# Patient Record
Sex: Female | Born: 1960 | Race: White | Hispanic: No | Marital: Married | Smoking: Never smoker
Health system: Southern US, Community
[De-identification: ages and names within clinical notes are randomized; demographics above are authoritative.]

## PROBLEM LIST (undated history)

## (undated) DIAGNOSIS — E785 Hyperlipidemia, unspecified: Secondary | ICD-10-CM

## (undated) DIAGNOSIS — Z9109 Other allergy status, other than to drugs and biological substances: Secondary | ICD-10-CM

## (undated) DIAGNOSIS — M8580 Other specified disorders of bone density and structure, unspecified site: Secondary | ICD-10-CM

## (undated) DIAGNOSIS — G4730 Sleep apnea, unspecified: Secondary | ICD-10-CM

## (undated) DIAGNOSIS — Z973 Presence of spectacles and contact lenses: Secondary | ICD-10-CM

## (undated) DIAGNOSIS — E119 Type 2 diabetes mellitus without complications: Secondary | ICD-10-CM

## (undated) DIAGNOSIS — I10 Essential (primary) hypertension: Secondary | ICD-10-CM

## (undated) HISTORY — PX: TONSILLECTOMY: SUR1361

## (undated) HISTORY — DX: Type 2 diabetes mellitus without complications: E11.9

## (undated) HISTORY — PX: COLONOSCOPY: SHX174

## (undated) HISTORY — PX: TUBAL LIGATION: SHX77

## (undated) HISTORY — PX: CHOLECYSTECTOMY: SHX55

## (undated) HISTORY — PX: APPENDECTOMY: SHX54

---

## 2005-05-03 HISTORY — PX: ABDOMINAL HYSTERECTOMY: SHX81

## 2005-06-14 ENCOUNTER — Other Ambulatory Visit: Admission: RE | Admit: 2005-06-14 | Discharge: 2005-06-14 | Payer: Self-pay | Admitting: Obstetrics and Gynecology

## 2005-07-16 ENCOUNTER — Ambulatory Visit (HOSPITAL_COMMUNITY): Admission: RE | Admit: 2005-07-16 | Discharge: 2005-07-16 | Payer: Self-pay | Admitting: Obstetrics and Gynecology

## 2005-07-29 ENCOUNTER — Encounter (INDEPENDENT_AMBULATORY_CARE_PROVIDER_SITE_OTHER): Payer: Self-pay | Admitting: *Deleted

## 2005-07-29 ENCOUNTER — Encounter (INDEPENDENT_AMBULATORY_CARE_PROVIDER_SITE_OTHER): Payer: Self-pay | Admitting: Specialist

## 2005-07-29 ENCOUNTER — Inpatient Hospital Stay (HOSPITAL_COMMUNITY): Admission: RE | Admit: 2005-07-29 | Discharge: 2005-07-31 | Payer: Self-pay | Admitting: Obstetrics and Gynecology

## 2006-08-25 LAB — HM DEXA SCAN

## 2006-11-10 ENCOUNTER — Ambulatory Visit: Payer: Self-pay | Admitting: Internal Medicine

## 2006-11-24 ENCOUNTER — Ambulatory Visit: Payer: Self-pay | Admitting: Internal Medicine

## 2006-11-24 ENCOUNTER — Encounter: Payer: Self-pay | Admitting: Internal Medicine

## 2007-09-08 IMAGING — CT CT PELVIS W/ CM
1 of 3 series · 13 of 32 positions shown, 18 images · IV contrast ([ID] READICAT & 150ml omni/300%)
Comparison: none

CLINICAL DATA: Cystic ovarian mass.  Possible ovarian neoplasm. 
 ABDOMEN CT WITH CONTRAST:
TECHNIQUE: Multidetector CT imaging of the abdomen was performed following the standard protocol during bolus administration of intravenous contrast.
 Contrast:  150 cc Omnipaque 300 and oral contrast.
TECHNIQUE: Multidetector CT imaging of the pelvis was performed following the standard protocol during bolus administration of intravenous contrast.

[Series 2: abd pelvis · axial · 0.71mm/px · z∈[-519,-109]mm · 13 of 94 slices shown, 18 images]
[im 6/94  soft-tissue]
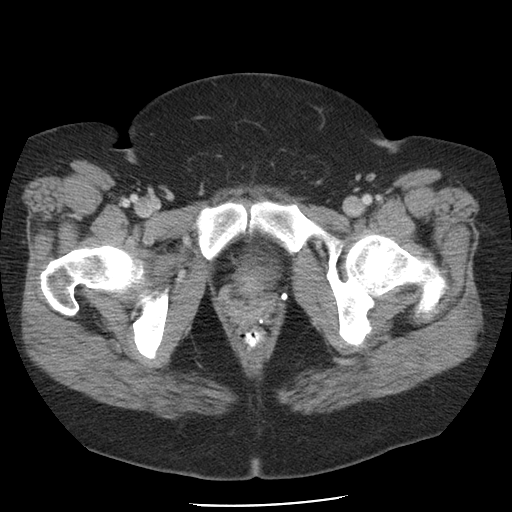
[im 6/94  bone]
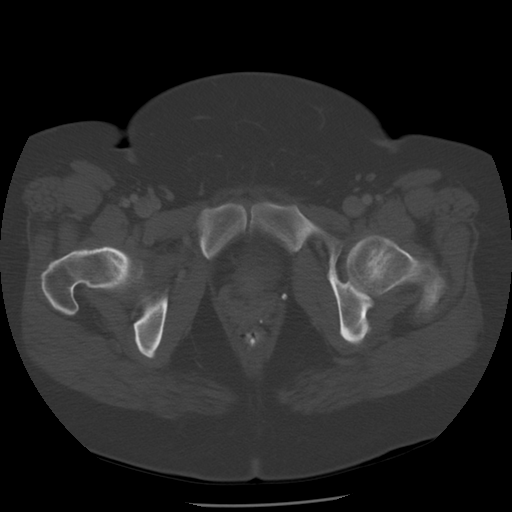
[im 16/94  soft-tissue]
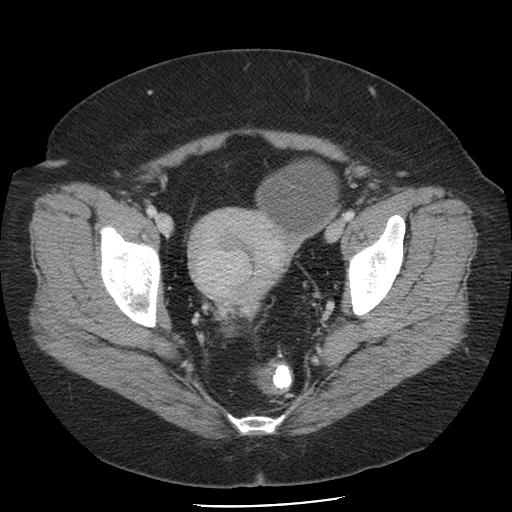
[im 21/94  soft-tissue]
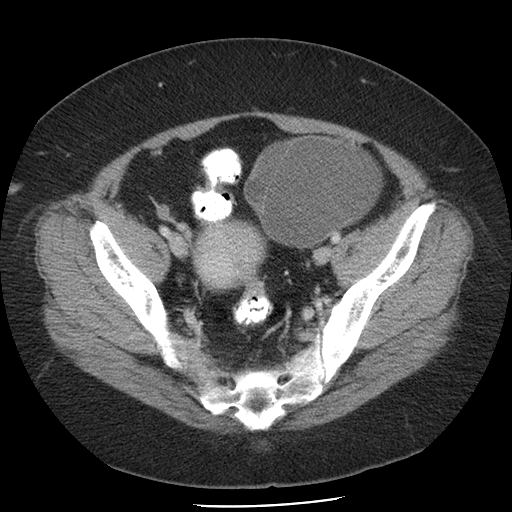
[im 26/94  soft-tissue]
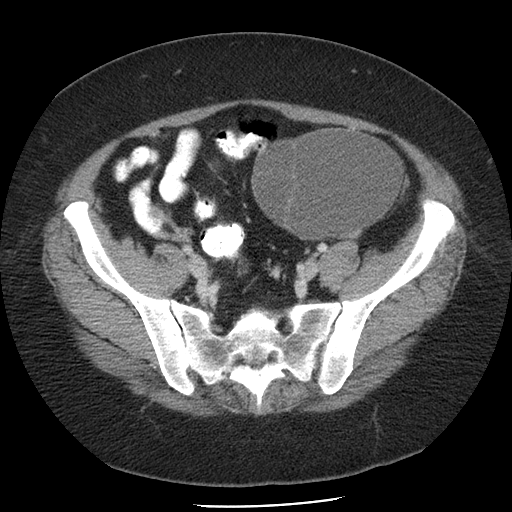
[im 37/94  soft-tissue]
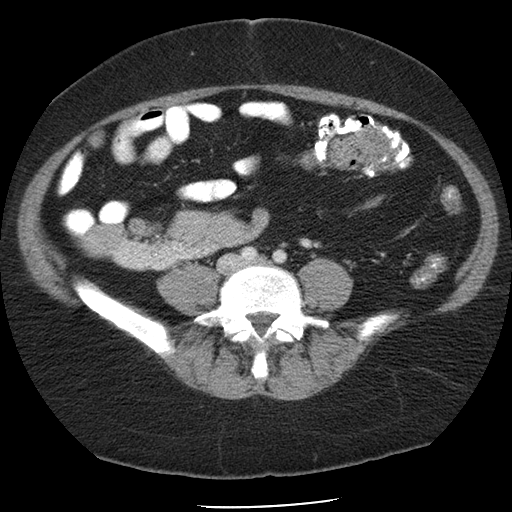
[im 42/94  soft-tissue]
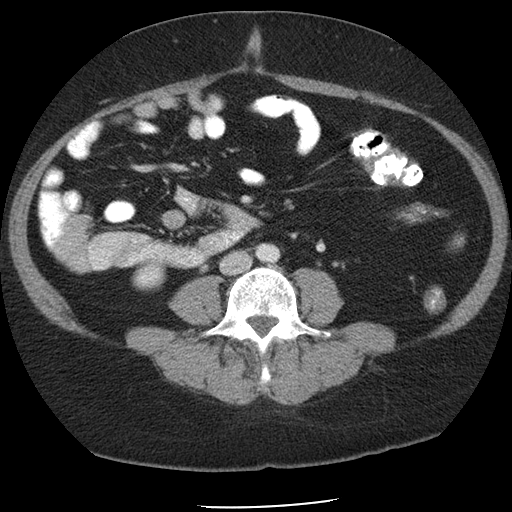
[im 52/94  soft-tissue]
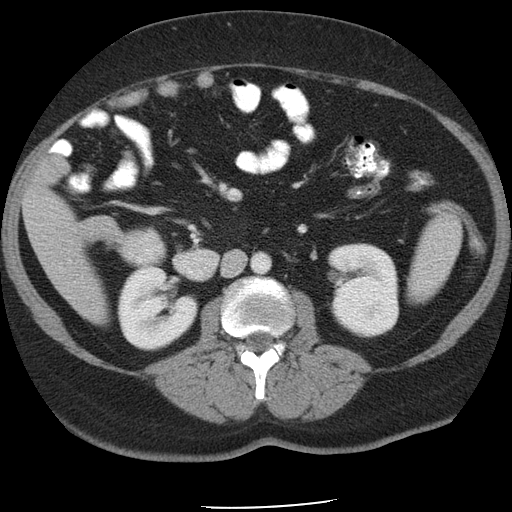
[im 57/94  soft-tissue]
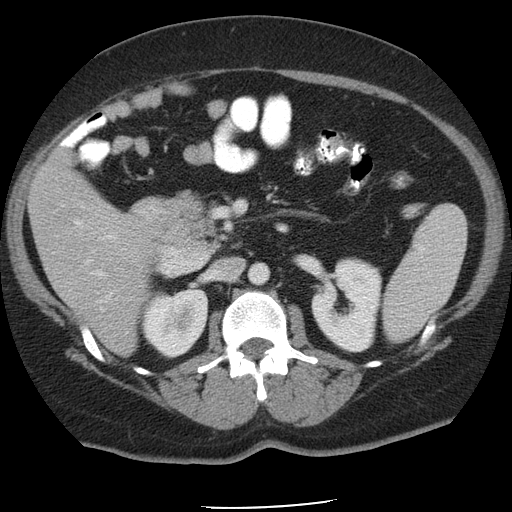
[im 68/94  soft-tissue]
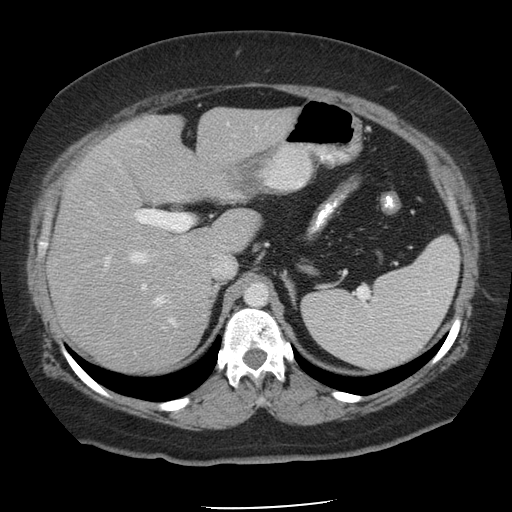
[im 68/94  bone]
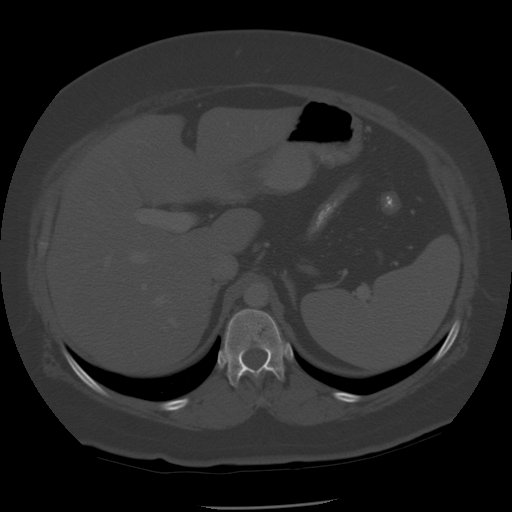
[im 73/94  soft-tissue]
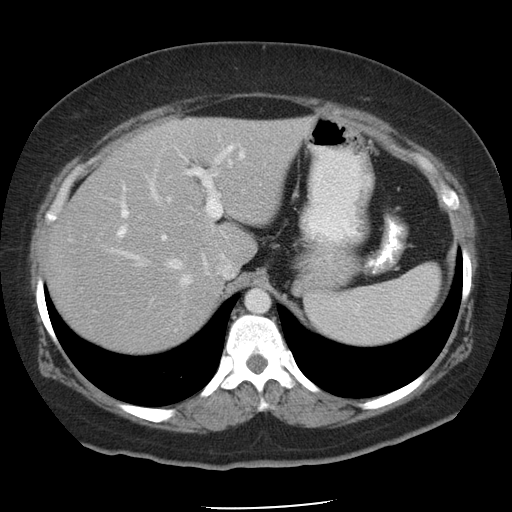
[im 73/94  lung]
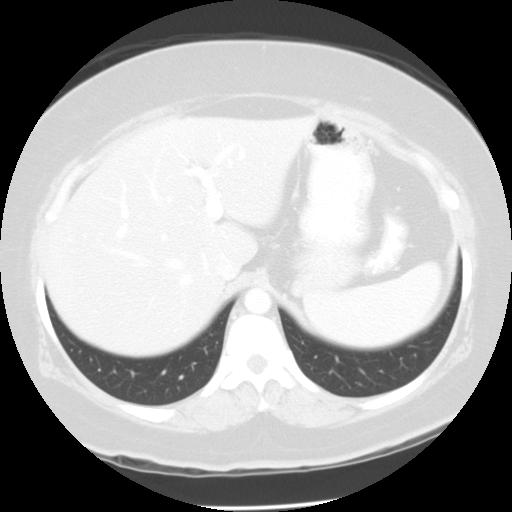
[im 78/94  soft-tissue]
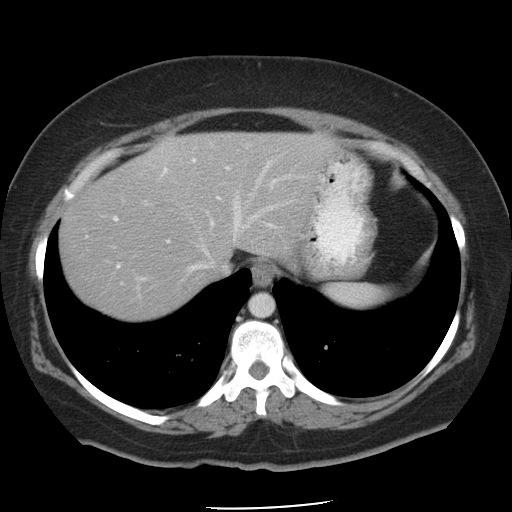
[im 78/94  lung]
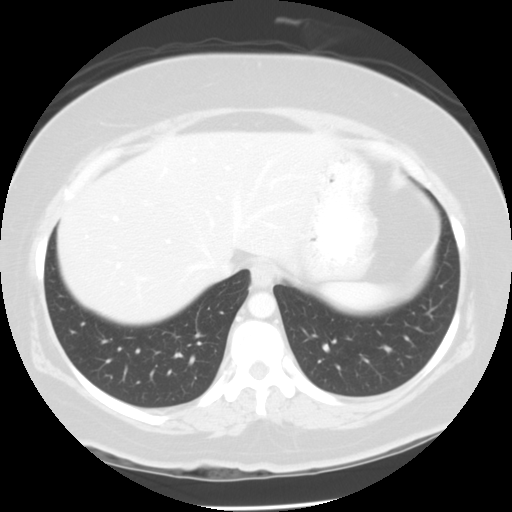
[im 83/94  lung]
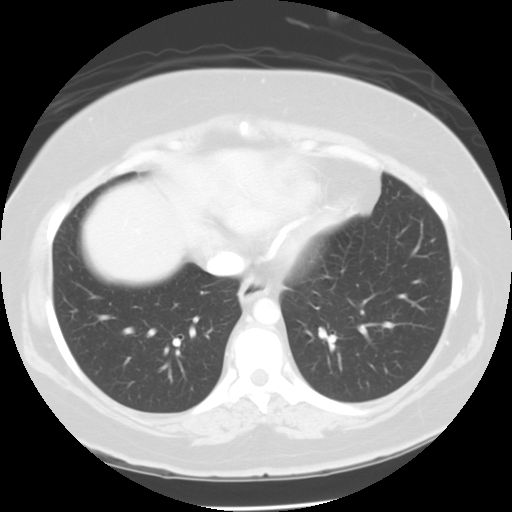
[im 88/94  soft-tissue]
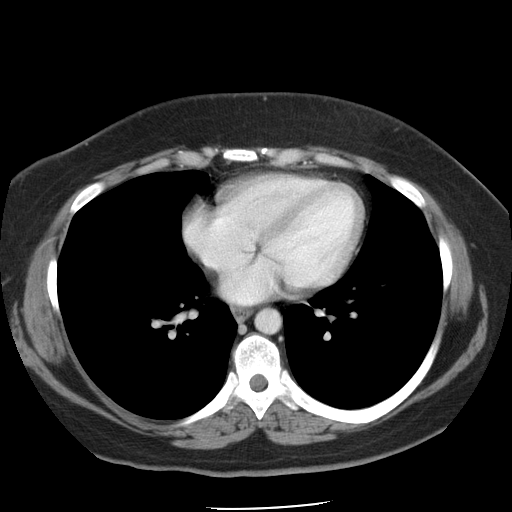
[im 88/94  lung]
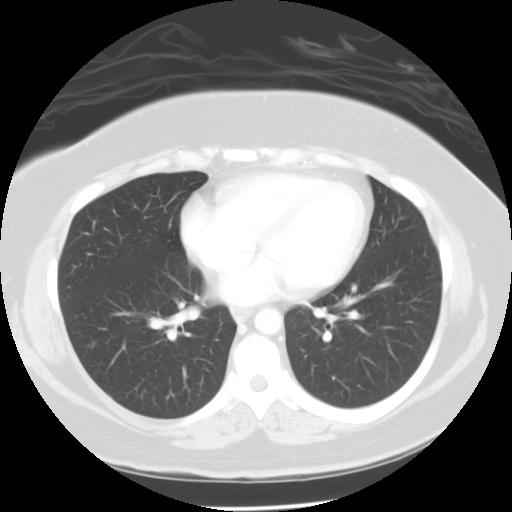

[13 of 32 positions shown; findings below may reference images not displayed]

FINDINGS: Surgical clips are seen from prior cholecystectomy.  There is no evidence of biliary ductal dilatation.  The liver, spleen, pancreas, adrenal glands, and kidneys are normal in appearance.  There is no evidence of abdominal or retroperitoneal adenopathy.  No abnormal soft tissue masses are seen in the abdomen, and there is no evidence of ascites or inflammatory process.
 Incidentally noted is congenital malrotation of bowel with the jejunum in the right abdomen and the entire colon including the cecum in the left abdomen.
IMPRESSION: 1.  No evidence of abdominal mass or other signs or metastatic disease.
 2.  Congenital malrotation of bowel incidentally noted with small bowel in the right abdomen and colon in the left abdomen.
 PELVIS CT WITH CONTRAST:
FINDINGS: A large cystic lesion with thin septations is seen in the left adnexa which measures 10.7 x 7.7 cm.  This is consistent with a cystic ovarian neoplasm.  The right ovary is visualized and is normal in appearance.  A fibroid is seen in the right uterine body which has a submucosal component extending into the endometrial cavity.  There is measures approximately 2.7 x 2.6 cm.  
 There is no evidence of pelvic adenopathy or peritoneal studding.  There is no evidence of ascites.  There is no evidence of inflammatory process.
IMPRESSION: 1.  10.7 cm cystic mass with thin internal septations in the left adnexa, consistent with cystic ovarian neoplasm. 
 2.  No evidence of ascites or other evidence of metastatic disease.  
 3.  3 cm right uterine fibroid with submucosal component.  Normal right ovary.

## 2010-07-13 LAB — HM COLONOSCOPY

## 2010-09-18 NOTE — H&P (Signed)
NAMECHAZLYN, CUDE                 ACCOUNT NO.:  [id number]   MEDICAL RECORD NO.:  [id number]          PATIENT TYPE:  AMB   LOCATION:  SDC                           FACILITY:  [facility]   PHYSICIAN:  Guy Sandifer. Henderson Cloud, M.D. DATE OF BIRTH:  Oct 05, 1960   DATE OF ADMISSION:  07/29/2005  DATE OF DISCHARGE:                                HISTORY & PHYSICAL   CHIEF COMPLAINT:  Heavy menses, urinary incontinence, and left ovarian cyst.   HISTORY OF PRESENT ILLNESS:  This patient is a 50 year old, married, white  female, G3 P3, status post tubal ligation who complains of increasingly  heavy and irregular menses over the past one and a half years.  She skips  some months and then other months will change a large pad and tampon every  hour.  She occasionally misses work with heavy bleeding.  She also complains  of increasingly bothersome leaking of urine.  She has to wear a pad on a  daily basis.  She leaks with coughing and sneezing.  On evaluation, she was  found to have evidence of pelvic relaxation.  Subsequent ultrasound, on  July 13, 2005, revealed a 2.5-cm intracavitary mass in the uterus  consistent with a fibroid.  At least two other leiomyomata were noted  measuring 2.9 and 2.0-cm.  The left ovary contained an 11-cm cystic mass  with thin septations.  Subsequent, abdominal pelvic CT was consistent with  the left adnexal mass with no other evidence of organomegaly, adenopathy, or  ascites.  CA-125 is 11.1.  Pap smear is benign.  Finally, urodynamic  evaluation is consistent with stress urinary incontinence.  After a careful  discussion of the above information with the patient and her husband, she is  being admitted for exploratory laparotomy, total abdominal hysterectomy,  bilateral salpingo-oophorectomy, Burch cystourethropexy, possible  paravaginal repair, and posterior vaginal repair.  All questions have been  answered and potential risks and complications have been discussed  preoperatively.   PAST MEDICAL HISTORY:  1.  Diabetes.  2.  History of esophageal reflux.   PAST SURGICAL HISTORY:  1.  Laparoscopic tubal ligation, 16 years ago.  2.  Cholecystectomy.   OBSTETRIC HISTORY:  Vaginal delivery x3.   MEDICATIONS:  Avandomet 2/500 b.i.d.   No known drug allergies.   FAMILY HISTORY:  Positive for colon cancer in father, chronic hypertension  in mother, heart surgery in father, reflux disease.   SOCIAL HISTORY:  Denies tobacco, alcohol, or drug abuse.   REVIEW OF SYSTEMS:  NEURO:  Denies headache.  CARDIO:  Denies chest pain.  PULMONARY:  Denies shortness of breath.  GI:  Denies recent changes in bowel  habits.   PHYSICAL EXAMINATION:  VITAL SIGNS:  Height 5 feet 6 inches, weight 222  pounds, blood pressure 126/78.  HEENT:  Without thyromegaly.  LUNGS:  Clear to auscultation.  HEART:  Regular rate and rhythm.  BACK:  Without CVA tenderness.  BREAST:  Without masses __________  discharge.  ABDOMEN:  Obese, soft with a mass effect arising in the left lower quadrant  to within three finger breadths  of the level of the umbilicus, mildly  tender.  PELVIC:  Vulva, vagina, cervix without lesion.  The uterus is upper limits  of normal size, quite mobile with first and second degree prolapse.  The  anterior vaginal wall was descended to within the vaginal introitus.  Right  adnexa is nontender without mass.  Left adnexa is mildly tender with a left  adnexal mass barely palpable with the vaginal hand.  Rectovaginal exam is  consistent with a lax rectovaginal septum.  There is adequate rectal  sphincter tone.  EXTREMITIES:  Grossly within normal limits.  NEUROLOGIC:  Grossly within normal limits.   ASSESSMENT:  1.  Left ovarian cyst.  2.  Urinary incontinence.  3.  Pelvic relaxation.   PLAN:  Exploratory laparotomy, total abdominal hysterectomy, bilateral  salpingo-oophorectomy, Burch cystourethropexy, possible paravaginal repair,  and posterior  vaginal repair.      Guy Sandifer Henderson Cloud, M.D.  Electronically Signed     JET/MEDQ  D:  07/27/2005  T:  07/27/2005  Job:  [id number]

## 2010-09-18 NOTE — Discharge Summary (Signed)
Kim Ashley, Kim Ashley                 ACCOUNT NO.:  [id number]   MEDICAL RECORD NO.:  [id number]          PATIENT TYPE:  INP   LOCATION:  9312                          FACILITY:  [facility]   PHYSICIAN:  Guy Sandifer. Henderson Cloud, M.D. DATE OF BIRTH:  07-Nov-1960   DATE OF ADMISSION:  07/29/2005  DATE OF DISCHARGE:  07/31/2005                                 DISCHARGE SUMMARY   ADMITTING DIAGNOSES:  1.  Left ovarian cyst.  2.  Uterine leiomyomata.  3.  Stress urinary continence.  4.  Pelvic relaxation.   DISCHARGE DIAGNOSES:  1.  Left ovarian cyst.  2.  Uterine leiomyomata.  3.  Stress urinary continence.  4.  Pelvic relaxation.   PROCEDURES:  On July 29, 2005, total abdominal hysterectomy with bilateral  salpingo-oophorectomy, paravaginal repair, Burch cystourethropexy,  cystoscopy and posterior vaginal repair.   REASON FOR ADMISSION:  The patient is a 50 year old, married, white female,  G3, P3, status post tubal ligation with increasingly heavy and irregular  menses as well as urine leakage.  Details were dictated in the History and  Physical.  She is admitted for surgical management.   HOSPITAL COURSE:  The patient is admitted to the hospital and undergoes the  above procedure.  Estimated blood loss was 400 mL.  On the evening of  surgery, she has good pain relief, stable vital signs and she is afebrile.  On postop day #1, she has good pain relief.  She is tolerating some regular  diet.  She has not yet passed flatus and the Foley has just been removed and  she has not had a chance to void.  She remains afebrile with stable vital  signs.  Hemoglobin was 9.1, white count 6.2 and pathology is pending.  She  continues to advance her diet and ambulate well.  On the day of discharge,  she is tolerating a regular diet, passing flatus, ambulating and voiding  with good pain control.  Blood sugars have been managed with sliding scale  insulin.  She remains afebrile with stable vital signs.   Incision is healing  well.  Pathology is pending.   CONDITION ON DISCHARGE:  Good.   DIET:  Diet regular as tolerated.   ACTIVITY:  No lifting, no operation of automobiles, no vaginal entry.   SPECIAL INSTRUCTIONS:  She is to call the office for problems including, but  not limited to, temperature of 101 degrees, increasing pain, persistent  nausea, vomiting or heavy vaginal bleeding.   FOLLOW UP:  Follow up in the office in 1 week for staple removal.   MEDICATIONS:  1.  Percocet 5/325 mg, #40, one to two p.o. q.6h. p.r.n.  2.  Ibuprofen 600 mg q.6h. p.r.n.  3.  Multivitamin daily.  4.  Ceftin 500 mg, #10, one p.o. b.i.d., no refills.  5.  Chromagen, 30, one p.o. q.d.  6.  She will resume her other preop medications and diabetes management per      her primary care physician.      Guy Sandifer Henderson Cloud, M.D.  Electronically Signed     JET/MEDQ  D:  07/31/2005  T:  07/31/2005  Job:  [id number]

## 2010-09-18 NOTE — Op Note (Signed)
Kim Ashley, Kim Ashley                 ACCOUNT NO.:  [id number]   MEDICAL RECORD NO.:  [id number]          PATIENT TYPE:  INP   LOCATION:  9312                          FACILITY:  [facility]   PHYSICIAN:  Guy Sandifer. Henderson Cloud, M.D. DATE OF BIRTH:  08-06-1960   DATE OF PROCEDURE:  07/29/2005  DATE OF DISCHARGE:                                 OPERATIVE REPORT   PREOPERATIVE DIAGNOSIS:  1.  Left ovarian cyst.  2.  Uterine leiomyomata.  3.  Stress urinary continence.  4.  Pelvic relaxation.   POSTOPERATIVE DIAGNOSIS:  1.  Left ovarian cyst.  2.  Uterine leiomyomata.  3.  Stress urinary continence.  4.  Pelvic relaxation.   PROCEDURE:  Total abdominal hysterectomy with bilateral salpingo-  oophorectomy, paravaginal repair, Burch cystourethropexy, cystoscopy, and  posterior vaginal repair.   SURGEON:  Harold Hedge, M.D.   ASSISTANT:  Zelphia Cairo, MD   ANESTHESIA:  General with endotracheal intubation.   ESTIMATED BLOOD LOSS:  400 mL   SPECIMENS:  Uterus, tubes and ovaries.   INDICATIONS:  The patient is a 50 year old married white female gravida 3,  para 3 status post tubal ligation with complaints of increasingly heavy  menses, leaking urine, and known left ovarian cyst.  Details are dictated in  history and physical.  Total abdominal hysterectomy and bilateral salpingo-  oophorectomy, paravaginal repair, Burch procedure, and posterior vaginal  repair have been discussed preoperatively.  Potential risks and  complications were discussed preoperatively including but not limited to  infection, bowel, bladder, ureteral damage, bleeding requiring transfusion  of blood products with possible transfusion reaction, HIV, hepatitis  acquisition, DVT, PE, pneumonia, fistula formation, postoperative  dyspareunia, vaginal narrowing requiring dilators, recurrent urinary  incontinence and/or pelvic relaxation, paresthesia and pelvic pain.  All  questions were answered and consent signed on the  chart.   FINDINGS:  There is a football shaped 11-12 cm smooth left ovarian cyst.  The right ovary is normal in appearance.  Tubes status post ligation  bilaterally.  Uterus, anterior, posterior cul-de-sacs were normal.   PROCEDURE:  The patient is taken to operating room where she is identified,  placed in dorsosupine position and general anesthesia is induced via  endotracheal intubation.  The legs were placed in the stirrups.  She is  prepped abdominally and vaginally.  Foley catheter with 30 mL balloon was  placed in the bladder and she is draped in sterile fashion.  Pfannenstiel  incision was then made and dissection is carried out in layers to the  peritoneum which was incised, extended superiorly and inferiorly.  Normal  saline was then instilled and the peritoneal cavity and fluid was withdrawn  and sent for cytology.  The left ovary was noted to have a smooth capsule.  It is adherent at one spot with some thin filmy adhesions to the epiploica  to the posterior side of the ovary which was easily taken down with sharp  dissection.  It is then elevated well up into the incision and the  infundibulopelvic ligament as well as the remainder of the mesovarium was  crossclamped  with clamps on each side and the ovary is resected in simple  fashion.  Is then sent for frozen section.  These pedicles were then both  doubly ligated with 0 Monocryl suture.  All suture will be 0 Monocryl unless  otherwise designated.  O'Connor-O'Sullivan retractor was then placed in the  incision.  The bladder blade was placed.  Bowel is packed away and the upper  blade is placed as well.  The left round ligament is ligated and then taken  down and the anterior and posterior leaves of the broad ligament are taken  down.  Blunt dissection in the retroperitoneal space identifies the left  ureter and was seen to be well clear of the area of surgery.  The  vesicouterine peritoneum was taken down anteriorly.  The  left uterine  vessels were skeletonized and then taken down and singly ligated.  The right  round ligament was then ligated, taken down and the anterior leaf of broad  ligament taken down as well.  Posterior leaf was bluntly perforated and the  infundibulopelvic ligament was crossclamped and taken down after identifying  the course of the right ureter.  Progressive bites were then taken on the  uterine vessels on the right and then the cardinal ligaments bilaterally.  This was carried down advancing the bladder sharply and bluntly and the  curved clamps were placed in the upper vaginal fornix bilaterally and these  pedicles were tied with transfixion sutures.  The uterus was completely  removed from the vagina. Inspection reveals the cervix was then removed in  total.  Vagina was then closed with figure-of-eights.  Good hemostasis was  noted all around.  Packs and retractors are removed and the anterior  peritoneum was closed in running fashion with 0 Monocryl suture.  Then blunt  dissection is used to enter the retropubic space.  Examining hand was placed  in the vagina as well.  Careful attention is paid to identify the ischial  spine on the right and left side.  Using a 2-0 Ethibond suture the right-  sided vagina is plicated to the fascia of the pelvic sidewall.  This begins  2 cm caudal to the ischial spine.  A 0 Vicryl suture is then used for the  Burch on the right side and is attached to Cooper's ligament on the right.  All of the sutures were held.  Similar procedure was carried out on the left  side.  Again attention is given to the ischial spine with the sutures being  at least 2 cm from this.  Two sutures of 2-0 Ethibond are used on the left  side of the vagina as well as a 0 Vicryl for the Burch stitch at the level  of the urethrovesical junction as on the right side.  All the sutures then  tied down giving good elevation to the anterior vaginal wall.  Good hemostasis was  noted.  The anterior rectus fascia is closed in running  fashion with 0 PDS suture starting at each angle and meeting in the middle.  0 plain suture was used to reapproximate the subcutaneous tissues and the  skin was closed with clips.  Attention was then turned to the vagina.  Cystoscopy was then performed and complete evaluation of 360 degrees manner  reveals no evidence of perforation.  No foreign body.  No suture.  A good  cuff of urine is noted several times from each ureter.  The urine is clear.  Cystoscope was removed.  A Foley catheter was placed.  Attention is then  turned to the vagina.  A small diamond shaped wedge of tissue was removed  from the posterior perineal body and the posterior vaginal mucosa was taken  down in the midline approximately 2/3 of the length of the vagina.  Dissection is used sharply and bluntly bilaterally.  0 Monocryl sutures are  then used to reapproximate the rectovaginal fascia in the midline.  Inspection reveals the vagina was not over corrected.  Excess mucosa was  trimmed and 2-0 Monocryl suture was used in a running locking fashion to  close the posterior vaginal wall down to the level of the perineal body.  This was then further dissected and interrupted 0 Monocryl suture was used  to reapproximate the posterior perineal body.  2-0 Monocryl sutures then  continued on down and the skin was closed in the standard episiotomy type  fashion.  The plain vaginal packing was then placed in the vagina.  All  counts were correct.  The patient is awakened, taken to recovery room in  stable condition.     Guy Sandifer Henderson Cloud, M.D.  Electronically Signed    JET/MEDQ  D:  07/29/2005  T:  07/30/2005  Job:  [id number]

## 2011-12-20 ENCOUNTER — Encounter: Payer: Self-pay | Admitting: Internal Medicine

## 2012-08-10 ENCOUNTER — Encounter: Payer: Self-pay | Admitting: Internal Medicine

## 2013-12-04 ENCOUNTER — Other Ambulatory Visit: Payer: Self-pay | Admitting: Obstetrics and Gynecology

## 2013-12-06 LAB — CYTOLOGY - PAP

## 2014-01-01 LAB — LIPID PANEL
Cholesterol: 168 mg/dL (ref 0–200)
HDL: 41 mg/dL (ref 35–70)
LDL CALC: 84 mg/dL
LDL/HDL RATIO: 2
TRIGLYCERIDES: 213 mg/dL — AB (ref 40–160)

## 2014-01-01 LAB — HEPATIC FUNCTION PANEL
ALK PHOS: 49 U/L (ref 25–125)
ALT: 20 U/L (ref 7–35)
AST: 15 U/L (ref 13–35)

## 2014-08-27 LAB — HEMOGLOBIN A1C: Hgb A1c MFr Bld: 7.9 % — AB (ref 4.0–6.0)

## 2014-10-11 ENCOUNTER — Other Ambulatory Visit: Payer: Self-pay | Admitting: Family Medicine

## 2014-10-25 ENCOUNTER — Other Ambulatory Visit: Payer: Self-pay | Admitting: Family Medicine

## 2014-10-30 DIAGNOSIS — I10 Essential (primary) hypertension: Secondary | ICD-10-CM | POA: Insufficient documentation

## 2014-10-30 DIAGNOSIS — E119 Type 2 diabetes mellitus without complications: Secondary | ICD-10-CM | POA: Insufficient documentation

## 2014-10-30 DIAGNOSIS — E669 Obesity, unspecified: Secondary | ICD-10-CM | POA: Insufficient documentation

## 2014-10-30 DIAGNOSIS — E785 Hyperlipidemia, unspecified: Secondary | ICD-10-CM | POA: Insufficient documentation

## 2014-10-30 DIAGNOSIS — G4733 Obstructive sleep apnea (adult) (pediatric): Secondary | ICD-10-CM | POA: Insufficient documentation

## 2014-10-30 DIAGNOSIS — M8580 Other specified disorders of bone density and structure, unspecified site: Secondary | ICD-10-CM | POA: Insufficient documentation

## 2014-10-30 DIAGNOSIS — K635 Polyp of colon: Secondary | ICD-10-CM | POA: Insufficient documentation

## 2014-12-09 ENCOUNTER — Other Ambulatory Visit: Payer: Self-pay | Admitting: Obstetrics and Gynecology

## 2014-12-10 LAB — CYTOLOGY - PAP

## 2014-12-23 ENCOUNTER — Ambulatory Visit (INDEPENDENT_AMBULATORY_CARE_PROVIDER_SITE_OTHER): Payer: BC Managed Care – PPO | Admitting: Family Medicine

## 2014-12-23 ENCOUNTER — Encounter: Payer: Self-pay | Admitting: Family Medicine

## 2014-12-23 VITALS — BP 112/74 | HR 72 | Temp 98.9°F | Resp 14 | Ht 64.0 in | Wt 214.0 lb

## 2014-12-23 DIAGNOSIS — E785 Hyperlipidemia, unspecified: Secondary | ICD-10-CM

## 2014-12-23 DIAGNOSIS — Z1211 Encounter for screening for malignant neoplasm of colon: Secondary | ICD-10-CM | POA: Diagnosis not present

## 2014-12-23 DIAGNOSIS — I10 Essential (primary) hypertension: Secondary | ICD-10-CM | POA: Diagnosis not present

## 2014-12-23 DIAGNOSIS — E119 Type 2 diabetes mellitus without complications: Secondary | ICD-10-CM

## 2014-12-23 DIAGNOSIS — Z0000 Encounter for general adult medical examination without abnormal findings: Secondary | ICD-10-CM | POA: Diagnosis not present

## 2014-12-23 LAB — POCT UA - MICROALBUMIN: MICROALBUMIN (UR) POC: 20 mg/L

## 2014-12-23 NOTE — Progress Notes (Signed)
Patient ID: Kim Ashley, female   DOB: 1961/03/02, 54 y.o.   MRN: [id number]  Visit Date: 12/23/2014  Today's Provider: Wilhemena Durie, MD   Chief Complaint  Patient presents with  . Annual Exam   Subjective:  Kim Ashley is a 54 y.o. female who presents today for health maintenance and complete physical. She feels well. She reports she is sleeping well.  LAST: labs-01/01/14  EKG-01/25/12  BMD-11/24/06-osteopenia  Colonoscopy-11/24/06-adenomatous polyps-repeat 5 years.-she has not had this repeated.  Mammogram and Pap smear done with [facility] on August 8th, 2016.  BP Readings from Last 3 Encounters:  12/23/14 112/74  08/27/14 118/72   She does mentions having pain in the lower abdomen/groin area x 4 months. It comes and goes, can not say there is a certain pattern except one time she remembers eating a lot of strawberry, kiwi , cucumbers, etc. That week. Sensation described as a sharp ache. Not severe ache. No urinary issues that patient can tell. She did have UA checked with gyn on August 8th and that was clear.   Review of Systems  Constitutional: Negative.   HENT: Negative.   Respiratory: Negative.   Cardiovascular: Positive for leg swelling (in the evenings-right leg mainly, she does sit a lot during the day).  Gastrointestinal: Positive for abdominal pain.  Endocrine: Negative.   Genitourinary: Negative.   Musculoskeletal: Negative.   Skin: Negative.   Allergic/Immunologic: Negative.   Neurological: Negative.   Hematological: Negative.   Psychiatric/Behavioral: Negative.     Social History   Social History  . Marital Status: Married    Spouse Name: Yvone Neu  . Number of Children: 3  . Years of Education: 14   Occupational History  . [employer]    Social History Main Topics  . Smoking status: Never Smoker   . Smokeless tobacco: Never Used  . Alcohol Use: No  . Drug Use: No  . Sexual Activity: Yes   Other Topics Concern  .  Not on file   Social History Narrative    Patient Active Problem List   Diagnosis Date Noted  . Colon polyp 10/30/2014  . Essential (primary) hypertension 10/30/2014  . HLD (hyperlipidemia) 10/30/2014  . Adiposity 10/30/2014  . Obstructive apnea 10/30/2014  . Osteopenia 10/30/2014  . Diabetes mellitus, type 2 10/30/2014    Past Surgical History  Procedure Laterality Date  . Abdominal hysterectomy  2007    due to tumor on lt ovary that was benign  . Tubal ligation    . Tonsillectomy      Her family history includes Arrhythmia in her mother; Colon cancer in her father; Diabetes in her brother and father; Heart attack in her mother; Hypertension in her brother and mother; Stomach cancer in her brother; Stroke in her father.    Outpatient Prescriptions Prior to Visit  Medication Sig Dispense Refill  . glipiZIDE (GLUCOTROL) 10 MG tablet TAKE 1 TABLET BY MOUTH TWICE DAILY 60 tablet 0  . glucose blood (BAYER CONTOUR NEXT TEST) test strip BAYER CONTOUR NEXT TEST (In Vitro Strip)  1 (one) strip and lancets strip and lancets check sugar twice daily for 0 days  Quantity: 100;  Refills: 12   Ordered :26-Sep-2013  Althea Charon ;  Started 26-Sep-2013 Active Comments: CONTOUR NEXT EZ. DX: 250.00  STRIPS AND LANCETS PLEASE 30 DAY SUPPLY    . JANUMET 50-1000 MG per tablet TAKE 1 TABLET BY MOUTH TWICE DAILY 60 tablet 0  . pravastatin (PRAVACHOL)  20 MG tablet TAKE 1 TABLET BY MOUTH EVERY DAY 30 tablet 0  . valsartan (DIOVAN) 160 MG tablet Take by mouth.     No facility-administered medications prior to visit.    Patient Care Team: Jerrol Banana., MD as PCP - General (Family Medicine)     Objective:   Vitals:  Filed Vitals:   12/23/14 0915  BP: 112/74  Pulse: 72  Temp: 98.9 F (37.2 C)  Resp: 14  Height: 5\' 4"  (1.626 m)  Weight: 214 lb (97.07 kg)    Physical Exam  Constitutional: She is oriented to person, place, and time. She appears well-developed.  HENT:   Head: Normocephalic.  Eyes: Conjunctivae are normal. Pupils are equal, round, and reactive to light.  Neck: Normal range of motion. Neck supple.  Cardiovascular: Normal rate, regular rhythm, normal heart sounds and intact distal pulses.   No murmur heard. Pulmonary/Chest: Effort normal and breath sounds normal. No respiratory distress. She has no wheezes.  Abdominal: Soft. Bowel sounds are normal. She exhibits no distension. There is no tenderness.  Specifically no mass effect or tenderness.  Musculoskeletal: Normal range of motion. She exhibits no edema or tenderness.  Neurological: She is alert and oriented to person, place, and time.  Skin: Skin is warm and dry. No rash noted.  Psychiatric: She has a normal mood and affect. Her behavior is normal. Thought content normal.     Depression Screen PHQ 2/9 Scores 12/23/2014  PHQ - 2 Score 0      Assessment & Plan:  1. Annual physical exam PAP and MAMMOGRAM per gyn. - CBC with Differential/Platelet - COMPLETE METABOLIC PANEL WITH GFR - TSH - Lipid Panel With LDL/HDL Ratio  2. Type 2 diabetes mellitus without complication Pending labs. - HgB A1c - POCT UA - Microalbumin  3. Colon cancer screening Refer for colonoscop and address lower abdominal pain. - Ambulatory referral to Gastroenterology  4. Essential (primary) hypertension Stable. - CBC with Differential/Platelet - COMPLETE METABOLIC PANEL WITH GFR  5. Hyperlipidemia Pending results - Lipid Panel With LDL/HDL Ratio  6. Lower abdominal pain Both mother and father had diverticulitis and patient feels like she has a same. Recent GYN exam was completely normal. Exam today is normal. The story does not fit with diverticulitis but it is time to refer back to GI anyway for follow-up colonoscopy. I have done the exam and reviewed the above chart and it is accurate to the best of my knowledge.   Patient was seen and examined by Dr. Eulas Post and note was scribed  by Theressa Millard, RMA.

## 2014-12-24 LAB — CBC WITH DIFFERENTIAL/PLATELET
BASOS: 0 %
Basophils Absolute: 0 10*3/uL (ref 0.0–0.2)
EOS (ABSOLUTE): 0.1 10*3/uL (ref 0.0–0.4)
EOS: 1 %
HEMOGLOBIN: 15.2 g/dL (ref 11.1–15.9)
Hematocrit: 44.1 % (ref 34.0–46.6)
IMMATURE GRANS (ABS): 0 10*3/uL (ref 0.0–0.1)
IMMATURE GRANULOCYTES: 0 %
LYMPHS: 24 %
Lymphocytes Absolute: 1.6 10*3/uL (ref 0.7–3.1)
MCH: 29.3 pg (ref 26.6–33.0)
MCHC: 34.5 g/dL (ref 31.5–35.7)
MCV: 85 fL (ref 79–97)
MONOCYTES: 7 %
Monocytes Absolute: 0.5 10*3/uL (ref 0.1–0.9)
NEUTROS ABS: 4.7 10*3/uL (ref 1.4–7.0)
NEUTROS PCT: 68 %
Platelets: 191 10*3/uL (ref 150–379)
RBC: 5.19 x10E6/uL (ref 3.77–5.28)
RDW: 13.2 % (ref 12.3–15.4)
WBC: 6.8 10*3/uL (ref 3.4–10.8)

## 2014-12-24 LAB — LIPID PANEL WITH LDL/HDL RATIO
CHOLESTEROL TOTAL: 186 mg/dL (ref 100–199)
HDL: 47 mg/dL (ref >39)
LDL CALC: 100 mg/dL — AB (ref 0–99)
LDL/HDL RATIO: 2.1 ratio (ref 0.0–3.2)
TRIGLYCERIDES: 195 mg/dL — AB (ref 0–149)
VLDL Cholesterol Cal: 39 mg/dL (ref 5–40)

## 2014-12-24 LAB — COMPREHENSIVE METABOLIC PANEL
A/G RATIO: 1.7 (ref 1.1–2.5)
ALT: 31 IU/L (ref 0–32)
AST: 24 IU/L (ref 0–40)
Albumin: 4.6 g/dL (ref 3.5–5.5)
Alkaline Phosphatase: 48 IU/L (ref 39–117)
BUN/Creatinine Ratio: 13 (ref 9–23)
BUN: 11 mg/dL (ref 6–24)
Bilirubin Total: 0.5 mg/dL (ref 0.0–1.2)
CALCIUM: 9.8 mg/dL (ref 8.7–10.2)
CO2: 22 mmol/L (ref 18–29)
CREATININE: 0.85 mg/dL (ref 0.57–1.00)
Chloride: 99 mmol/L (ref 97–108)
GFR, EST AFRICAN AMERICAN: 91 mL/min/{1.73_m2} (ref >59)
GFR, EST NON AFRICAN AMERICAN: 79 mL/min/{1.73_m2} (ref >59)
Globulin, Total: 2.7 g/dL (ref 1.5–4.5)
Glucose: 201 mg/dL — ABNORMAL HIGH (ref 65–99)
POTASSIUM: 4.7 mmol/L (ref 3.5–5.2)
Sodium: 140 mmol/L (ref 134–144)
TOTAL PROTEIN: 7.3 g/dL (ref 6.0–8.5)

## 2014-12-24 LAB — HEMOGLOBIN A1C
Est. average glucose Bld gHb Est-mCnc: 183 mg/dL
Hgb A1c MFr Bld: 8 % — ABNORMAL HIGH (ref 4.8–5.6)

## 2014-12-24 LAB — TSH: TSH: 5.4 u[IU]/mL — AB (ref 0.450–4.500)

## 2015-02-17 ENCOUNTER — Ambulatory Visit: Payer: BC Managed Care – PPO | Admitting: Gastroenterology

## 2015-02-20 ENCOUNTER — Ambulatory Visit (INDEPENDENT_AMBULATORY_CARE_PROVIDER_SITE_OTHER): Payer: BC Managed Care – PPO | Admitting: Gastroenterology

## 2015-02-20 ENCOUNTER — Encounter: Payer: Self-pay | Admitting: Gastroenterology

## 2015-02-20 ENCOUNTER — Other Ambulatory Visit: Payer: Self-pay

## 2015-02-20 VITALS — BP 131/71 | Temp 98.0°F | Ht 64.0 in | Wt 218.0 lb

## 2015-02-20 DIAGNOSIS — G8929 Other chronic pain: Secondary | ICD-10-CM | POA: Diagnosis not present

## 2015-02-20 DIAGNOSIS — R1031 Right lower quadrant pain: Secondary | ICD-10-CM

## 2015-02-20 DIAGNOSIS — Z86010 Personal history of colonic polyps: Secondary | ICD-10-CM

## 2015-02-20 NOTE — Progress Notes (Addendum)
Gastroenterology Consultation  Referring Provider:     Jerrol Banana.,* Primary Care Physician:  Wilhemena Durie, MD Primary Gastroenterologist:  Dr. Allen Norris     Reason for Consultation:     Right lower quadrant pain        HPI:   Kim Ashley is a 54 y.o. y/o female referred for consultation & management of lower quadrant pain by Dr. Wilhemena Durie, MD.  This patient comes today with a history of needing a colonoscopy due to a history of colon polyps. The patient reports that she had 2 colon polyps at her previous colonoscopy. The patient was supposed to have a repeat colonoscopy in 5 years and that was back in 2008. There is no report of any unexplained weight loss but she does report to have some right lower quadrant abdominal pain that is really right near the hip bone. The patient denies any change in bowel habits such as diarrhea or constipation. She also denies any black stools or bloody stools. There is no report of any unexplained weight loss.  Past Medical History  Diagnosis Date  . Diabetes mellitus without complication [facility])     Past Surgical History  Procedure Laterality Date  . Abdominal hysterectomy  2007    due to tumor on lt ovary that was benign  . Tubal ligation    . Tonsillectomy    . Cholecystectomy    . Appendectomy      Prior to Admission medications   Medication Sig Start Date End Date Taking? Authorizing Provider  glipiZIDE (GLUCOTROL) 10 MG tablet TAKE 1 TABLET BY MOUTH TWICE DAILY 10/25/14  Yes Richard Maceo Pro., MD  glucose blood (BAYER CONTOUR NEXT TEST) test strip BAYER CONTOUR NEXT TEST (In Vitro Strip)  1 (one) strip and lancets strip and lancets check sugar twice daily for 0 days  Quantity: 100;  Refills: 12   Ordered :26-Sep-2013  Althea Charon ;  Started 26-Sep-2013 Active Comments: CONTOUR NEXT [place]. DX: 250.00  STRIPS AND LANCETS PLEASE 30 DAY SUPPLY 09/26/13  Yes Historical Provider, MD  JANUMET 50-1000 MG per tablet TAKE  1 TABLET BY MOUTH TWICE DAILY 10/25/14  Yes Jerrol Banana., MD  pravastatin (PRAVACHOL) 20 MG tablet TAKE 1 TABLET BY MOUTH EVERY DAY 10/14/14  Yes Jerrol Banana., MD  valsartan (DIOVAN) 160 MG tablet Take by mouth. 08/27/14  Yes Historical Provider, MD    Family History  Problem Relation Age of Onset  . Hypertension Mother   . Heart attack Mother   . Arrhythmia Mother   . Colon cancer Father   . Diabetes Father   . Stroke Father   . Diabetes Brother   . Hypertension Brother   . Stomach cancer Brother      Social History  Substance Use Topics  . Smoking status: Never Smoker   . Smokeless tobacco: Never Used  . Alcohol Use: No    Allergies as of 02/20/2015 - Review Complete 02/20/2015  Allergen Reaction Noted  . Byetta 10 mcg pen  [exenatide]  10/30/2014  . Lisinopril  10/30/2014  . Niacin  10/30/2014  . Simvastatin  10/30/2014    Review of Systems:    All systems reviewed and negative except where noted in HPI.   Physical Exam:  BP 131/71 mmHg  Temp(Src) 98 F (36.7 C) (Oral)  Ht 5\' 4"  (1.626 m)  Wt 218 lb (98.884 kg)  BMI 37.40 kg/m2 No LMP recorded. Patient has  had a hysterectomy. Psych:  Alert and cooperative. Normal mood and affect. General:   Alert,  Well-developed, obese, pleasant and cooperative in NAD Head:  Normocephalic and atraumatic. Eyes:  Sclera clear, no icterus.   Conjunctiva pink. Ears:  Normal auditory acuity. Nose:  No deformity, discharge, or lesions. Mouth:  No deformity or lesions,oropharynx pink & moist. Neck:  Supple; no masses or thyromegaly. Lungs:  Respirations even and unlabored.  Clear throughout to auscultation.   No wheezes, crackles, or rhonchi. No acute distress. Heart:  Regular rate and rhythm; no murmurs, clicks, rubs, or gallops. Abdomen:  Normal bowel sounds.  No bruits.  Soft, non-tender and non-distended without masses, hepatosplenomegaly or hernias noted.  No guarding or rebound tenderness.  Positive Carnett  sign.   Rectal:  Deferred.  Msk:  Symmetrical without gross deformities.  Good, equal movement & strength bilaterally. Pulses:  Normal pulses noted. Extremities:  No clubbing or edema.  No cyanosis. Neurologic:  Alert and oriented x3;  grossly normal neurologically. Skin:  Intact without significant lesions or rashes.  No jaundice. Lymph Nodes:  No significant cervical adenopathy. Psych:  Alert and cooperative. Normal mood and affect.  Imaging Studies: No results found.  Assessment and Plan:   Kim Ashley is a 54 y.o. y/o female who comes in today with a history of colon polyps and needs a repeat colonoscopy due to surveillance for colon polyps. The patient states that she had 2 polyps that were precancerous. The patient also reports some right lower quadrant pain that is consistent with muscular skeletal pain. The patient has been explained the plan and agrees with it.I have discussed risks & benefits which include, but are not limited to, bleeding, infection, perforation & drug reaction.  The patient agrees with this plan & written consent will be obtained.     Note: This dictation was prepared with Dragon dictation along with smaller phrase technology. Any transcriptional errors that result from this process are unintentional.

## 2015-03-17 ENCOUNTER — Telehealth: Payer: Self-pay | Admitting: Family Medicine

## 2015-03-17 NOTE — Telephone Encounter (Signed)
Pt has congestion, sinus stuff.  Wants to know if we can call her back.    Her call back is  [phone]  Thanks Con Memos

## 2015-03-17 NOTE — Telephone Encounter (Signed)
Advised patient that we could not prescribe antibiotics without being seen.  Recommeded Mucinex as the patient does not want to come in.

## 2015-04-15 ENCOUNTER — Encounter: Payer: Self-pay | Admitting: *Deleted

## 2015-04-17 NOTE — Discharge Instructions (Signed)

## 2015-04-21 ENCOUNTER — Ambulatory Visit: Payer: BC Managed Care – PPO | Admitting: Anesthesiology

## 2015-04-21 ENCOUNTER — Other Ambulatory Visit: Payer: Self-pay | Admitting: Gastroenterology

## 2015-04-21 ENCOUNTER — Encounter
Admission: RE | Disposition: A | Discharge status: Home / Self Care | Payer: Self-pay | Source: Ambulatory Visit | Attending: Gastroenterology

## 2015-04-21 ENCOUNTER — Ambulatory Visit
Admission: RE | Admit: 2015-04-21 | Discharge: 2015-04-21 | Disposition: A | Discharge status: Home / Self Care | Payer: BC Managed Care – PPO | Source: Ambulatory Visit | Attending: Gastroenterology | Admitting: Gastroenterology

## 2015-04-21 DIAGNOSIS — Z1211 Encounter for screening for malignant neoplasm of colon: Secondary | ICD-10-CM | POA: Diagnosis not present

## 2015-04-21 DIAGNOSIS — D125 Benign neoplasm of sigmoid colon: Secondary | ICD-10-CM | POA: Diagnosis not present

## 2015-04-21 DIAGNOSIS — I10 Essential (primary) hypertension: Secondary | ICD-10-CM | POA: Diagnosis not present

## 2015-04-21 DIAGNOSIS — Z833 Family history of diabetes mellitus: Secondary | ICD-10-CM | POA: Insufficient documentation

## 2015-04-21 DIAGNOSIS — Z800 Family history of malignant neoplasm of digestive organs: Secondary | ICD-10-CM | POA: Insufficient documentation

## 2015-04-21 DIAGNOSIS — Z860100 Personal history of colon polyps, unspecified: Secondary | ICD-10-CM | POA: Insufficient documentation

## 2015-04-21 DIAGNOSIS — D122 Benign neoplasm of ascending colon: Secondary | ICD-10-CM | POA: Diagnosis not present

## 2015-04-21 DIAGNOSIS — E119 Type 2 diabetes mellitus without complications: Secondary | ICD-10-CM | POA: Diagnosis not present

## 2015-04-21 DIAGNOSIS — K641 Second degree hemorrhoids: Secondary | ICD-10-CM | POA: Insufficient documentation

## 2015-04-21 DIAGNOSIS — Z90710 Acquired absence of both cervix and uterus: Secondary | ICD-10-CM | POA: Insufficient documentation

## 2015-04-21 DIAGNOSIS — Z79899 Other long term (current) drug therapy: Secondary | ICD-10-CM | POA: Diagnosis not present

## 2015-04-21 DIAGNOSIS — Z8249 Family history of ischemic heart disease and other diseases of the circulatory system: Secondary | ICD-10-CM | POA: Diagnosis not present

## 2015-04-21 HISTORY — DX: Other allergy status, other than to drugs and biological substances: Z91.09

## 2015-04-21 HISTORY — DX: Presence of spectacles and contact lenses: Z97.3

## 2015-04-21 HISTORY — PX: COLONOSCOPY WITH PROPOFOL: SHX5780

## 2015-04-21 HISTORY — DX: Essential (primary) hypertension: I10

## 2015-04-21 HISTORY — PX: POLYPECTOMY: SHX149

## 2015-04-21 LAB — GLUCOSE, CAPILLARY
GLUCOSE-CAPILLARY: 145 mg/dL — AB (ref 65–99)
GLUCOSE-CAPILLARY: 209 mg/dL — AB (ref 65–99)

## 2015-04-21 SURGERY — COLONOSCOPY WITH PROPOFOL
Anesthesia: Monitor Anesthesia Care | Wound class: Contaminated

## 2015-04-21 MED ORDER — PROPOFOL 10 MG/ML IV BOLUS
INTRAVENOUS | Status: DC | PRN
  Administered 2015-04-21: 30 mg via INTRAVENOUS
  Administered 2015-04-21 (×5): 50 mg via INTRAVENOUS
  Administered 2015-04-21 (×2): 30 mg via INTRAVENOUS
  Administered 2015-04-21 (×2): 20 mg via INTRAVENOUS
  Administered 2015-04-21: 50 mg via INTRAVENOUS
  Administered 2015-04-21: 20 mg via INTRAVENOUS
  Administered 2015-04-21: 100 mg via INTRAVENOUS

## 2015-04-21 MED ORDER — LACTATED RINGERS IV SOLN
INTRAVENOUS | Status: DC
  Administered 2015-04-21: 08:00:00 via INTRAVENOUS

## 2015-04-21 MED ORDER — LIDOCAINE HCL (CARDIAC) 20 MG/ML IV SOLN
INTRAVENOUS | Status: DC | PRN
  Administered 2015-04-21: 40 mg via INTRAVENOUS

## 2015-04-21 SURGICAL SUPPLY — 28 items
CANISTER SUCT 1200ML W/VALVE (MISCELLANEOUS) ×4 IMPLANT
FCP ESCP3.2XJMB 240X2.8X (MISCELLANEOUS)
FORCEPS BIOP RAD 4 LRG CAP 4 (CUTTING FORCEPS) ×2 IMPLANT
FORCEPS BIOP RJ4 240 W/NDL (MISCELLANEOUS)
FORCEPS ESCP3.2XJMB 240X2.8X (MISCELLANEOUS) IMPLANT
GOWN CVR UNV OPN BCK APRN NK (MISCELLANEOUS) ×4 IMPLANT
GOWN ISOL THUMB LOOP REG UNIV (MISCELLANEOUS) ×8
HEMOCLIP INSTINCT (CLIP) IMPLANT
INJECTOR VARIJECT VIN23 (MISCELLANEOUS) IMPLANT
KIT CO2 TUBING (TUBING) IMPLANT
KIT DEFENDO VALVE AND CONN (KITS) IMPLANT
KIT ENDO PROCEDURE OLY (KITS) ×4 IMPLANT
LIGATOR MULTIBAND 6SHOOTER MBL (MISCELLANEOUS) IMPLANT
MARKER SPOT ENDO TATTOO 5ML (MISCELLANEOUS) IMPLANT
PAD GROUND ADULT SPLIT (MISCELLANEOUS) IMPLANT
SNARE SHORT THROW 13M SML OVAL (MISCELLANEOUS) ×2 IMPLANT
SNARE SHORT THROW 30M LRG OVAL (MISCELLANEOUS) IMPLANT
SPOT EX ENDOSCOPIC TATTOO (MISCELLANEOUS)
SUCTION POLY TRAP 4CHAMBER (MISCELLANEOUS) ×2 IMPLANT
TRAP SUCTION POLY (MISCELLANEOUS) IMPLANT
TUBING CONN 6MMX3.1M (TUBING)
TUBING SUCTION CONN 0.25 STRL (TUBING) IMPLANT
UNDERPAD 30X60 958B10 (PK) (MISCELLANEOUS) IMPLANT
VALVE BIOPSY ENDO (VALVE) IMPLANT
VARIJECT INJECTOR VIN23 (MISCELLANEOUS)
WATER AUXILLARY (MISCELLANEOUS) IMPLANT
WATER STERILE IRR 250ML POUR (IV SOLUTION) ×4 IMPLANT
WATER STERILE IRR 500ML POUR (IV SOLUTION) IMPLANT

## 2015-04-21 NOTE — Anesthesia Postprocedure Evaluation (Signed)
Anesthesia Post Note  Patient: Kim Ashley  Procedure(s) Performed: Procedure(s) (LRB): COLONOSCOPY WITH PROPOFOL (N/A) POLYPECTOMY INTESTINAL  Patient location during evaluation: PACU Anesthesia Type: General Level of consciousness: awake and alert Pain management: pain level controlled Vital Signs Assessment: post-procedure vital signs reviewed and stable Respiratory status: spontaneous breathing, nonlabored ventilation, respiratory function stable and patient connected to nasal cannula oxygen Cardiovascular status: blood pressure returned to baseline and stable Postop Assessment: no signs of nausea or vomiting Anesthetic complications: no    Lyal Husted C

## 2015-04-21 NOTE — H&P (Signed)
[facility]  [address] [place], [place] [address] Phone: [phone] Fax : [phone]  Primary Care Physician:  Wilhemena Durie, MD Primary Gastroenterologist:  Dr. Allen Norris  Pre-Procedure History & Physical: HPI:  Kim Ashley is a 54 y.o. female is here for an colonoscopy.   Past Medical History  Diagnosis Date  . Diabetes mellitus without complication ([place])   . Hypertension   . Environmental allergies   . Wears contact lenses     Past Surgical History  Procedure Laterality Date  . Abdominal hysterectomy  2007    due to tumor on lt ovary that was benign  . Tubal ligation    . Tonsillectomy    . Cholecystectomy    . Appendectomy    . Colonoscopy      Prior to Admission medications   Medication Sig Start Date End Date Taking? Authorizing Provider  glipiZIDE (GLUCOTROL) 10 MG tablet TAKE 1 TABLET BY MOUTH TWICE DAILY 10/25/14  Yes Richard Maceo Pro., MD  glucose blood (BAYER CONTOUR NEXT TEST) test strip BAYER CONTOUR NEXT TEST (In Vitro Strip)  1 (one) strip and lancets strip and lancets check sugar twice daily for 0 days  Quantity: 100;  Refills: 12   Ordered :26-Sep-2013  Althea Charon ;  Started 26-Sep-2013 Active Comments: CONTOUR NEXT [place]. DX: 250.00  STRIPS AND LANCETS PLEASE 30 DAY SUPPLY 09/26/13  Yes Historical Provider, MD  JANUMET 50-1000 MG per tablet TAKE 1 TABLET BY MOUTH TWICE DAILY 10/25/14  Yes Jerrol Banana., MD  loratadine (CLARITIN) 10 MG tablet Take 10 mg by mouth daily. AM   Yes Historical Provider, MD  pravastatin (PRAVACHOL) 20 MG tablet TAKE 1 TABLET BY MOUTH EVERY DAY 10/14/14  Yes Richard Maceo Pro., MD  valsartan (DIOVAN) 160 MG tablet Take by mouth. 08/27/14  Yes Historical Provider, MD    Allergies as of 02/20/2015 - Review Complete 02/20/2015  Allergen Reaction Noted  . Byetta 10 mcg pen  [exenatide]  10/30/2014  . Lisinopril  10/30/2014  . Niacin  10/30/2014  . Simvastatin  10/30/2014     Family History  Problem Relation Age of Onset  . Hypertension Mother   . Heart attack Mother   . Arrhythmia Mother   . Colon cancer Father   . Diabetes Father   . Stroke Father   . Diabetes Brother   . Hypertension Brother   . Stomach cancer Brother     Social History   Social History  . Marital Status: Married    Spouse Name: Yvone Neu  . Number of Children: 3  . Years of Education: 14   Occupational History  . [employer]    Social History Main Topics  . Smoking status: Never Smoker   . Smokeless tobacco: Never Used  . Alcohol Use: No  . Drug Use: No  . Sexual Activity: Yes   Other Topics Concern  . Not on file   Social History Narrative    Review of Systems: See HPI, otherwise negative ROS  Physical Exam: BP 129/81 mmHg  Pulse 92  Temp(Src) 97.7 F (36.5 C) (Temporal)  Resp 16  Ht 5\' 4"  (1.626 m)  Wt 207 lb (93.895 kg)  BMI 35.51 kg/m2  SpO2 97% General:   Alert,  pleasant and cooperative in NAD Head:  Normocephalic and atraumatic. Neck:  Supple; no masses or thyromegaly. Lungs:  Clear throughout to auscultation.    Heart:  Regular rate and rhythm. Abdomen:  Soft, nontender and nondistended. Normal bowel sounds, without guarding, and without rebound.   Neurologic:  Alert and  oriented x4;  grossly normal neurologically.  Impression/Plan: Kim Ashley is here for an colonoscopy to be performed for history of colon polyps  Risks, benefits, limitations, and alternatives regarding  colonoscopy have been reviewed with the patient.  Questions have been answered.  All parties agreeable.   Ollen Bowl, MD  04/21/2015, 7:51 AM

## 2015-04-21 NOTE — Anesthesia Preprocedure Evaluation (Signed)
Anesthesia Evaluation  Patient identified by MRN, date of birth, ID band Patient awake    Reviewed: Allergy & Precautions, NPO status , Patient's Chart, lab work & pertinent test results  Airway Mallampati: II  TM Distance: >3 FB Neck ROM: Full    Dental no notable dental hx.    Pulmonary neg pulmonary ROS,    Pulmonary exam normal breath sounds clear to auscultation       Cardiovascular hypertension, Normal cardiovascular exam Rhythm:Regular Rate:Normal     Neuro/Psych negative neurological ROS  negative psych ROS   GI/Hepatic negative GI ROS, Neg liver ROS,   Endo/Other  negative endocrine ROSdiabetes, Type 2  Renal/GU negative Renal ROS  negative genitourinary   Musculoskeletal negative musculoskeletal ROS (+)   Abdominal   Peds negative pediatric ROS (+)  Hematology negative hematology ROS (+)   Anesthesia Other Findings   Reproductive/Obstetrics negative OB ROS                             Anesthesia Physical Anesthesia Plan  ASA: II  Anesthesia Plan: MAC   Post-op Pain Management:    Induction: Intravenous  Airway Management Planned:   Additional Equipment:   Intra-op Plan:   Post-operative Plan: Extubation in OR  Informed Consent: I have reviewed the patients History and Physical, chart, labs and discussed the procedure including the risks, benefits and alternatives for the proposed anesthesia with the patient or authorized representative who has indicated his/her understanding and acceptance.   Dental advisory given  Plan Discussed with: CRNA  Anesthesia Plan Comments:         Anesthesia Quick Evaluation

## 2015-04-21 NOTE — Transfer of Care (Signed)
Immediate Anesthesia Transfer of Care Note  Patient: Kim Ashley  Procedure(s) Performed: Procedure(s) with comments: COLONOSCOPY WITH PROPOFOL (N/A) - Diabetic - oral meds POLYPECTOMY INTESTINAL  Patient Location: PACU  Anesthesia Type: MAC  Level of Consciousness: awake, alert  and patient cooperative  Airway and Oxygen Therapy: Patient Spontanous Breathing and Patient connected to supplemental oxygen  Post-op Assessment: Post-op Vital signs reviewed, Patient's Cardiovascular Status Stable, Respiratory Function Stable, Patent Airway and No signs of Nausea or vomiting  Post-op Vital Signs: Reviewed and stable  Complications: No apparent anesthesia complications

## 2015-04-21 NOTE — Op Note (Signed)
[facility]astroenterology Patient Name: Kim Ashley Procedure Date: 04/21/2015 8:22 AM MRN: [id number] Account #: [id number] Date of Birth: 02-Oct-1960 Admit Type: Outpatient Age: 54 Room: [facility] OR ROOM 01 Gender: Female Note Status: Finalized Procedure:         Colonoscopy Indications:       High risk colon cancer surveillance: Personal history of                     colonic polyps Providers:         Lucilla Lame, MD Referring MD:      Janine Ores. Rosanna Randy, MD (Referring MD) Medicines:         Propofol per Anesthesia Complications:     No immediate complications. Procedure:         Pre-Anesthesia Assessment:                    - Prior to the procedure, a History and Physical was                     performed, and patient medications and allergies were                     reviewed. The patient's tolerance of previous anesthesia                     was also reviewed. The risks and benefits of the procedure                     and the sedation options and risks were discussed with the                     patient. All questions were answered, and informed consent                     was obtained. Prior Anticoagulants: The patient has taken                     no previous anticoagulant or antiplatelet agents. ASA                     Grade Assessment: II - A patient with mild systemic                     disease. After reviewing the risks and benefits, the                     patient was deemed in satisfactory condition to undergo                     the procedure.                    After obtaining informed consent, the colonoscope was                     passed under direct vision. Throughout the procedure, the                     patient's blood pressure, pulse, and oxygen saturations                     were monitored continuously. The [place]  colonoscope (S#: [id number]) was introduced through the anus                     and advanced to  the the cecum, identified by appendiceal                     orifice and ileocecal valve. The colonoscopy was performed                     without difficulty. The patient tolerated the procedure                     well. The quality of the bowel preparation was excellent. Findings:      The perianal and digital rectal examinations were normal.      A 5 mm polyp was found in the sigmoid colon. The polyp was sessile. The       polyp was removed with a cold biopsy forceps. Resection and retrieval       were complete.      A 8 mm polyp was found in the sigmoid colon. The polyp was sessile. The       polyp was removed with a cold snare. Resection and retrieval were       complete.      A 6 mm polyp was found in the ascending colon. The polyp was sessile.       The polyp was removed with a cold snare. Resection and retrieval were       complete.      Non-bleeding internal hemorrhoids were found during retroflexion. The       hemorrhoids were Grade II (internal hemorrhoids that prolapse but reduce       spontaneously). Impression:        - One 5 mm polyp in the sigmoid colon. Resected and                     retrieved.                    - One 8 mm polyp in the sigmoid colon. Resected and                     retrieved.                    - One 6 mm polyp in the ascending colon. Resected and                     retrieved.                    - Non-bleeding internal hemorrhoids. Recommendation:    - Await pathology results.                    - Repeat colonoscopy in 5 years for surveillance. Procedure Code(s): --- Professional ---                    [phone], Colonoscopy, flexible; with removal of tumor(s),                     polyp(s), or other lesion(s) by snare technique                    [id number], 28, Colonoscopy, flexible; with biopsy, single or  multiple Diagnosis Code(s): --- Professional ---                    Z86.010, Personal history of colonic polyps                     D12.5, Benign neoplasm of sigmoid colon                    D12.2, Benign neoplasm of ascending colon CPT copyright 2014 American Medical Association. All rights reserved. The codes documented in this report are preliminary and upon coder review may  be revised to meet current compliance requirements. Lucilla Lame, MD 04/21/2015 8:52:52 AM This report has been signed electronically. Number of Addenda: 0 Note Initiated On: 04/21/2015 8:22 AM Scope Withdrawal Time: 0 hours 8 minutes 30 seconds  Total Procedure Duration: 0 hours 21 minutes 17 seconds       [facility]

## 2015-04-22 ENCOUNTER — Encounter: Payer: Self-pay | Admitting: Gastroenterology

## 2015-04-23 ENCOUNTER — Encounter: Payer: Self-pay | Admitting: Gastroenterology

## 2015-04-24 ENCOUNTER — Encounter: Payer: Self-pay | Admitting: Family Medicine

## 2015-04-24 ENCOUNTER — Ambulatory Visit (INDEPENDENT_AMBULATORY_CARE_PROVIDER_SITE_OTHER): Payer: BC Managed Care – PPO | Admitting: Family Medicine

## 2015-04-24 VITALS — BP 108/62 | HR 68 | Temp 98.2°F | Resp 16 | Wt 214.0 lb

## 2015-04-24 DIAGNOSIS — Z23 Encounter for immunization: Secondary | ICD-10-CM | POA: Diagnosis not present

## 2015-04-24 DIAGNOSIS — E119 Type 2 diabetes mellitus without complications: Secondary | ICD-10-CM

## 2015-04-24 DIAGNOSIS — E669 Obesity, unspecified: Secondary | ICD-10-CM | POA: Diagnosis not present

## 2015-04-24 DIAGNOSIS — I10 Essential (primary) hypertension: Secondary | ICD-10-CM

## 2015-04-24 DIAGNOSIS — E785 Hyperlipidemia, unspecified: Secondary | ICD-10-CM

## 2015-04-24 LAB — POCT GLYCOSYLATED HEMOGLOBIN (HGB A1C): Hemoglobin A1C: 8

## 2015-04-24 NOTE — Progress Notes (Signed)
Patient ID: Kim Ashley, female   DOB: 1961/04/15, 54 y.o.   MRN: [id number]    Subjective:  HPI  Diabetes follow up: Patient is here for 4 months follow up. She is checking her sugar and for fasting readings are around 140-147, she checks sugar occasionally. No hypoglycemic episodes in a long time. She takes Glipizide and Janumet. Tolerating this well.Not following diet or getting any exercise. Lab Results  Component Value Date   HGBA1C 8.0* 12/23/2014   Routine labs were done on 12/23/14.  Prior to Admission medications   Medication Sig Start Date End Date Taking? Authorizing Provider  glipiZIDE (GLUCOTROL) 10 MG tablet TAKE 1 TABLET BY MOUTH TWICE DAILY 10/25/14  Yes Richard Maceo Pro., MD  glucose blood (BAYER CONTOUR NEXT TEST) test strip BAYER CONTOUR NEXT TEST (In Vitro Strip)  1 (one) strip and lancets strip and lancets check sugar twice daily for 0 days  Quantity: 100;  Refills: 12   Ordered :26-Sep-2013  Althea Charon ;  Started 26-Sep-2013 Active Comments: CONTOUR NEXT [place]. DX: 250.00  STRIPS AND LANCETS PLEASE 30 DAY SUPPLY 09/26/13  Yes Historical Provider, MD  JANUMET 50-1000 MG per tablet TAKE 1 TABLET BY MOUTH TWICE DAILY 10/25/14  Yes Jerrol Banana., MD  loratadine (CLARITIN) 10 MG tablet Take 10 mg by mouth daily. AM   Yes Historical Provider, MD  pravastatin (PRAVACHOL) 20 MG tablet TAKE 1 TABLET BY MOUTH EVERY DAY 10/14/14  Yes Richard Maceo Pro., MD  valsartan (DIOVAN) 160 MG tablet Take by mouth. 08/27/14  Yes Historical Provider, MD    Patient Active Problem List   Diagnosis Date Noted  . Personal history of colonic polyps   . Benign neoplasm of ascending colon   . Benign neoplasm of sigmoid colon   . Colon polyp 10/30/2014  . Essential (primary) hypertension 10/30/2014  . HLD (hyperlipidemia) 10/30/2014  . Adiposity 10/30/2014  . Obstructive apnea 10/30/2014  . Osteopenia 10/30/2014  . Diabetes mellitus, type 2 ([place]) 10/30/2014    Past  Medical History  Diagnosis Date  . Diabetes mellitus without complication ([place])   . Hypertension   . Environmental allergies   . Wears contact lenses     Social History   Social History  . Marital Status: Married    Spouse Name: Yvone Neu  . Number of Children: 3  . Years of Education: 14   Occupational History  . [employer]    Social History Main Topics  . Smoking status: Never Smoker   . Smokeless tobacco: Never Used  . Alcohol Use: No  . Drug Use: No  . Sexual Activity: Yes   Other Topics Concern  . Not on file   Social History Narrative    Allergies  Allergen Reactions  . Byetta 10 Mcg Pen [Exenatide] Nausea And Vomiting  . Lisinopril Cough  . Niacin Other (See Comments)    Joint aches, flushing  . Simvastatin Other (See Comments)    Joint aches, flushing    Review of Systems  Constitutional: Negative.   Respiratory: Negative.   Cardiovascular: Negative.   Gastrointestinal: Negative.   Musculoskeletal: Negative.   Neurological: Negative.   Endo/Heme/Allergies: Negative.   Psychiatric/Behavioral: Negative.     Immunization History  Administered Date(s) Administered  . Pneumococcal Polysaccharide-23 07/13/2010, 05/23/2012  . Tdap 07/13/2010  . Zoster 07/13/2010   Objective:  BP 108/62 mmHg  Pulse 68  Temp(Src) 98.2 F (36.8 C)  Resp 16  Wt 214 lb (  97.07 kg)  Physical Exam  Constitutional: She is oriented to person, place, and time and well-developed, well-nourished, and in no distress.  HENT:  Head: Normocephalic and atraumatic.  Eyes: Conjunctivae are normal. Pupils are equal, round, and reactive to light.  Neck: Normal range of motion. Neck supple.  Cardiovascular: Normal rate, regular rhythm, normal heart sounds and intact distal pulses.   Pulmonary/Chest: Effort normal and breath sounds normal. No respiratory distress. She has no wheezes.  Musculoskeletal: Normal range of motion. She exhibits no edema or tenderness.    Neurological: She is alert and oriented to person, place, and time. Gait normal.  Skin: Skin is warm and dry.  Psychiatric: Mood, memory, affect and judgment normal.    Lab Results  Component Value Date   WBC 6.8 12/23/2014   HCT 44.1 12/23/2014   GLUCOSE 201* 12/23/2014   CHOL 186 12/23/2014   TRIG 195* 12/23/2014   HDL 47 12/23/2014   LDLCALC 100* 12/23/2014   TSH 5.400* 12/23/2014   HGBA1C 8.0* 12/23/2014    CMP     Component Value Date/Time   NA 140 12/23/2014 1014   K 4.7 12/23/2014 1014   CL 99 12/23/2014 1014   CO2 22 12/23/2014 1014   GLUCOSE 201* 12/23/2014 1014   BUN 11 12/23/2014 1014   CREATININE 0.85 12/23/2014 1014   CALCIUM 9.8 12/23/2014 1014   PROT 7.3 12/23/2014 1014   ALBUMIN 4.6 12/23/2014 1014   AST 24 12/23/2014 1014   ALT 31 12/23/2014 1014   ALKPHOS 48 12/23/2014 1014   BILITOT 0.5 12/23/2014 1014   GFRNONAA 79 12/23/2014 1014   GFRAA 91 12/23/2014 1014    Assessment and Plan :  1. Essential (primary) hypertension Stable.  2. Type 2 diabetes mellitus without complication, without long-term current use of insulin (HCC) A1C 8.0. Same. Discussed with patient getting this under better control with working on habits. We may need to make changes to the medications if readings do not improve. - POCT HgB A1C--8.0 today.  3. Adiposity Work on habits.  4. HLD (hyperlipidemia) Stable on last visit.  5. Need for influenza vaccination - Flu Vaccine QUAD 36+ mos IM  I have done the exam and reviewed the above chart and it is accurate to the best of my knowledge.  Miguel Aschoff MD [facility] 04/24/2015 8:18 AM

## 2015-08-26 ENCOUNTER — Ambulatory Visit: Payer: BC Managed Care – PPO | Admitting: Family Medicine

## 2015-08-30 ENCOUNTER — Other Ambulatory Visit: Payer: Self-pay | Admitting: Family Medicine

## 2015-11-05 ENCOUNTER — Other Ambulatory Visit: Payer: Self-pay | Admitting: Family Medicine

## 2015-12-07 ENCOUNTER — Other Ambulatory Visit: Payer: Self-pay | Admitting: Family Medicine

## 2016-01-07 ENCOUNTER — Other Ambulatory Visit: Payer: Self-pay | Admitting: Family Medicine

## 2016-02-11 ENCOUNTER — Other Ambulatory Visit: Payer: Self-pay | Admitting: Family Medicine

## 2016-03-11 ENCOUNTER — Other Ambulatory Visit: Payer: Self-pay | Admitting: Family Medicine

## 2016-04-22 ENCOUNTER — Ambulatory Visit (INDEPENDENT_AMBULATORY_CARE_PROVIDER_SITE_OTHER): Payer: BC Managed Care – PPO | Admitting: Family Medicine

## 2016-04-22 ENCOUNTER — Encounter: Payer: Self-pay | Admitting: Family Medicine

## 2016-04-22 VITALS — BP 122/78 | HR 88 | Temp 98.0°F | Resp 16 | Wt 218.0 lb

## 2016-04-22 DIAGNOSIS — E119 Type 2 diabetes mellitus without complications: Secondary | ICD-10-CM

## 2016-04-22 DIAGNOSIS — I10 Essential (primary) hypertension: Secondary | ICD-10-CM | POA: Diagnosis not present

## 2016-04-22 LAB — POCT GLYCOSYLATED HEMOGLOBIN (HGB A1C): Hemoglobin A1C: 10.6

## 2016-04-22 MED ORDER — VALSARTAN 160 MG PO TABS: 160.0000 mg | ORAL_TABLET | Freq: Every day | ORAL | 5 refills | Status: DC

## 2016-04-22 MED ORDER — METFORMIN HCL 1000 MG PO TABS: 1000.0000 mg | ORAL_TABLET | Freq: Two times a day (BID) | ORAL | 5 refills | Status: DC

## 2016-04-22 MED ORDER — GLIPIZIDE 10 MG PO TABS
10.0000 mg | ORAL_TABLET | Freq: Two times a day (BID) | ORAL | 5 refills | Status: DC
Start: 2016-04-22 — End: 2016-11-10

## 2016-04-22 MED ORDER — EMPAGLIFLOZIN 25 MG PO TABS: 25.0000 mg | ORAL_TABLET | Freq: Every day | ORAL | 5 refills | Status: DC

## 2016-04-22 NOTE — Progress Notes (Signed)
Subjective:  HPI  Diabetes Mellitus Type II, Follow-up:   Lab Results  Component Value Date   HGBA1C 10.6 04/22/2016   HGBA1C 8.0 04/24/2015   HGBA1C 8.0 (H) 12/23/2014    Last seen for diabetes 1 years ago.  Management since then includes none. She reports poor compliance with treatment. She is not having side effects.  Current symptoms include none and have been unchanged. Home blood sugar records: not being checked  Episodes of hypoglycemia? no Current exercise: none  Pertinent Labs:    Component Value Date/Time   CHOL 186 12/23/2014 1014   TRIG 195 (H) 12/23/2014 1014   HDL 47 12/23/2014 1014   LDLCALC 100 (H) 12/23/2014 1014   CREATININE 0.85 12/23/2014 1014    Wt Readings from Last 3 Encounters:  04/22/16 218 lb (98.9 kg)  04/24/15 214 lb (97.1 kg)  04/21/15 207 lb (93.9 kg)    ------------------------------------------------------------------------  Pt has been out of glipizide for about 2 months. She has been taking the Janumet.    Hypertension, follow-up:  BP Readings from Last 3 Encounters:  04/22/16 122/78  04/24/15 108/62  04/21/15 123/78    She was last seen for hypertension 1 years ago.  BP at that visit was 108/62. Management since that visit includes none Wt Readings from Last 3 Encounters:  04/22/16 218 lb (98.9 kg)  04/24/15 214 lb (97.1 kg)  04/21/15 207 lb (93.9 kg)    ------------------------------------------------------------------------  Pt has not been taking her Valsartan.   Prior to Admission medications   Medication Sig Start Date End Date Taking? Authorizing Provider  glipiZIDE (GLUCOTROL) 10 MG tablet TAKE 1 TABLET BY MOUTH TWICE DAILY 02/12/16   Jerrol Banana., MD  glucose blood (BAYER CONTOUR NEXT TEST) test strip BAYER CONTOUR NEXT TEST (In Vitro Strip)  1 (one) strip and lancets strip and lancets check sugar twice daily for 0 days  Quantity: 100;  Refills: 12   Ordered :26-Sep-2013  Althea Charon ;   Started 26-Sep-2013 Active Comments: CONTOUR NEXT [place]. DX: 250.00  STRIPS AND LANCETS PLEASE 30 DAY SUPPLY 09/26/13   Historical Provider, MD  JANUMET 50-1000 MG tablet TAKE 1 TABLET BY MOUTH TWICE DAILY 11/05/15   Jerrol Banana., MD  loratadine (CLARITIN) 10 MG tablet Take 10 mg by mouth daily. AM    Historical Provider, MD  pravastatin (PRAVACHOL) 20 MG tablet TAKE 1 TABLET BY MOUTH DAILY 11/05/15   Jerrol Banana., MD  pravastatin (PRAVACHOL) 20 MG tablet TAKE 1 TABLET BY MOUTH DAILY 02/12/16   Jerrol Banana., MD  valsartan (DIOVAN) 160 MG tablet TAKE 1 TABLET BY MOUTH ONCE DAILY 02/12/16   Jerrol Banana., MD    Patient Active Problem List   Diagnosis Date Noted  . Personal history of colonic polyps   . Benign neoplasm of ascending colon   . Benign neoplasm of sigmoid colon   . Colon polyp 10/30/2014  . Essential (primary) hypertension 10/30/2014  . HLD (hyperlipidemia) 10/30/2014  . Adiposity 10/30/2014  . Obstructive apnea 10/30/2014  . Osteopenia 10/30/2014  . Diabetes mellitus, type 2 ([place]) 10/30/2014    Past Medical History:  Diagnosis Date  . Diabetes mellitus without complication ([place])   . Environmental allergies   . Hypertension   . Wears contact lenses     Social History   Social History  . Marital status: Married    Spouse name: Yvone Neu  . Number of children: 3  .  Years of education: 49   Occupational History  . [employer]    Social History Main Topics  . Smoking status: Never Smoker  . Smokeless tobacco: Never Used  . Alcohol use No  . Drug use: No  . Sexual activity: Yes   Other Topics Concern  . Not on file   Social History Narrative  . No narrative on file    Allergies  Allergen Reactions  . Byetta 10 Mcg Pen [Exenatide] Nausea And Vomiting  . Lisinopril Cough  . Niacin Other (See Comments)    Joint aches, flushing  . Simvastatin Other (See Comments)    Joint aches, flushing    Review of  Systems  Constitutional: Negative.   HENT: Negative.        Post nasal drainage.   Eyes: Negative.   Respiratory: Negative.   Cardiovascular: Negative.   Gastrointestinal: Negative.   Genitourinary: Negative.   Musculoskeletal: Negative.   Skin: Negative.   Neurological: Negative.   Endo/Heme/Allergies: Negative.   Psychiatric/Behavioral: Negative.     Immunization History  Administered Date(s) Administered  . Influenza,inj,Quad PF,36+ Mos 04/24/2015  . Influenza-Unspecified 04/12/2016  . Pneumococcal Polysaccharide-23 07/13/2010, 05/23/2012  . Tdap 07/13/2010  . Zoster 07/13/2010    Objective:  BP 122/78 (BP Location: Left Arm, Patient Position: Sitting, Cuff Size: Large)   Pulse 88   Temp 98 F (36.7 C) (Oral)   Resp 16   Wt 218 lb (98.9 kg)   BMI 37.42 kg/m   Physical Exam  Constitutional: She is oriented to person, place, and time and well-developed, well-nourished, and in no distress.  HENT:  Head: Normocephalic and atraumatic.  Right Ear: External ear normal.  Left Ear: External ear normal.  Nose: Nose normal.  Eyes: Conjunctivae and EOM are normal. Pupils are equal, round, and reactive to light.  Neck: Normal range of motion. Neck supple.  Cardiovascular: Normal rate, regular rhythm, normal heart sounds and intact distal pulses.   Pulmonary/Chest: Effort normal and breath sounds normal.  Abdominal: Soft.  Musculoskeletal: Normal range of motion.  Neurological: She is alert and oriented to person, place, and time. She has normal reflexes. Gait normal. GCS score is 15.  Skin: Skin is warm and dry.  Psychiatric: Mood, memory, affect and judgment normal.    Lab Results  Component Value Date   WBC 6.8 12/23/2014   HCT 44.1 12/23/2014   PLT 191 12/23/2014   GLUCOSE 201 (H) 12/23/2014   CHOL 186 12/23/2014   TRIG 195 (H) 12/23/2014   HDL 47 12/23/2014   LDLCALC 100 (H) 12/23/2014   TSH 5.400 (H) 12/23/2014   HGBA1C 10.6 04/22/2016   MICROALBUR 20  12/23/2014    CMP     Component Value Date/Time   NA 140 12/23/2014 1014   K 4.7 12/23/2014 1014   CL 99 12/23/2014 1014   CO2 22 12/23/2014 1014   GLUCOSE 201 (H) 12/23/2014 1014   BUN 11 12/23/2014 1014   CREATININE 0.85 12/23/2014 1014   CALCIUM 9.8 12/23/2014 1014   PROT 7.3 12/23/2014 1014   ALBUMIN 4.6 12/23/2014 1014   AST 24 12/23/2014 1014   ALT 31 12/23/2014 1014   ALKPHOS 48 12/23/2014 1014   BILITOT 0.5 12/23/2014 1014   GFRNONAA 79 12/23/2014 1014   GFRAA 91 12/23/2014 1014    Assessment and Plan :  1. Type 2 diabetes mellitus without complication, without long-term current use of insulin (HCC) A1C 10.6 today.   - empagliflozin (JARDIANCE) 25  MG TABS tablet; Take 25 mg by mouth daily.  Dispense: 30 tablet; Refill: 5 - glipiZIDE (GLUCOTROL) 10 MG tablet; Take 1 tablet (10 mg total) by mouth 2 (two) times daily.  Dispense: 60 tablet; Refill: 5 - valsartan (DIOVAN) 160 MG tablet; Take 1 tablet (160 mg total) by mouth daily.  Dispense: 30 tablet; Refill: 5 - metFORMIN (GLUCOPHAGE) 1000 MG tablet; Take 1 tablet (1,000 mg total) by mouth 2 (two) times daily with a meal.  Dispense: 60 tablet; Refill: 5 - POCT HgB A1C  2. Essential (primary) hypertension 3.HLD 4.Noncompliance with follow up Importance of follow-up and medications and checking blood sugars is stressed. Refills given for 6 months. I'll see her back in 3-4 months.   HPI, Exam, and A&P Transcribed under the direction and in the presence of Richard L. Cranford Mon, MD  Electronically Signed: Webb Laws, CMA I have done the exam and reviewed the above chart and it is accurate to the best of my knowledge. Development worker, community has been used in this note in any air is in the dictation or transcription are unintentional.  [facility] 04/22/2016 3:41 PM.

## 2016-08-23 ENCOUNTER — Ambulatory Visit (INDEPENDENT_AMBULATORY_CARE_PROVIDER_SITE_OTHER): Payer: BC Managed Care – PPO | Admitting: Family Medicine

## 2016-08-23 VITALS — BP 112/62 | HR 100 | Temp 98.0°F | Resp 16 | Wt 210.0 lb

## 2016-08-23 DIAGNOSIS — E7800 Pure hypercholesterolemia, unspecified: Secondary | ICD-10-CM

## 2016-08-23 DIAGNOSIS — I10 Essential (primary) hypertension: Secondary | ICD-10-CM | POA: Diagnosis not present

## 2016-08-23 DIAGNOSIS — E119 Type 2 diabetes mellitus without complications: Secondary | ICD-10-CM | POA: Diagnosis not present

## 2016-08-23 DIAGNOSIS — Z6836 Body mass index (BMI) 36.0-36.9, adult: Secondary | ICD-10-CM

## 2016-08-23 LAB — POCT GLYCOSYLATED HEMOGLOBIN (HGB A1C)
Est. average glucose Bld gHb Est-mCnc: 183
Hemoglobin A1C: 8

## 2016-08-23 LAB — POCT UA - MICROALBUMIN: Microalbumin Ur, POC: NEGATIVE mg/L

## 2016-08-23 NOTE — Progress Notes (Signed)
Patient: Kim Ashley Female    DOB: May 20, 1960   56 y.o.   MRN: [id number] Visit Date: 08/23/2016  Today's Provider: Wilhemena Durie, MD   Chief Complaint  Patient presents with  . Diabetes  . Hyperlipidemia   Subjective:    HPI      Diabetes Mellitus Type II, Follow-up:   Lab Results  Component Value Date   HGBA1C 10.6 04/22/2016   HGBA1C 8.0 04/24/2015   HGBA1C 8.0 (H) 12/23/2014    Last seen for diabetes 4 months ago.  Management since then includes adding Jardiance. She reports good compliance with treatment. She is having side effects. Some yeast, which has improved. Current symptoms include none and have been stable. Home blood sugar records: fasting range: 170's->200  Episodes of hypoglycemia? no   Current Insulin Regimen: N/A Most Recent Eye Exam: has an eye exam scheduled for this summer with Dr. Ellin Mayhew. Weight trend: decreasing steadily Prior visit with dietician: yes Current diet: in general, a "healthy" diet   Current exercise: joined exercise class and has elliptical at home  Pertinent Labs:    Component Value Date/Time   CHOL 186 12/23/2014 1014   TRIG 195 (H) 12/23/2014 1014   HDL 47 12/23/2014 1014   LDLCALC 100 (H) 12/23/2014 1014   CREATININE 0.85 12/23/2014 1014    Wt Readings from Last 3 Encounters:  08/23/16 210 lb (95.3 kg)  04/22/16 218 lb (98.9 kg)  04/24/15 214 lb (97.1 kg)    ------------------------------------------------------------------------  Lipid/Cholesterol, Follow-up:   Last seen for this over 1 year ago.   Last Lipid Panel:    Component Value Date/Time   CHOL 186 12/23/2014 1014   TRIG 195 (H) 12/23/2014 1014   HDL 47 12/23/2014 1014   LDLCALC 100 (H) 12/23/2014 1014    Pt was not given Pravastatin refill last time her medications were refilled.   -------------------------------------------------------------------   Allergies  Allergen Reactions  . Byetta 10 Mcg Pen [Exenatide] Nausea  And Vomiting  . Lisinopril Cough  . Niacin Other (See Comments)    Joint aches, flushing  . Simvastatin Other (See Comments)    Joint aches, flushing     Current Outpatient Prescriptions:  .  empagliflozin (JARDIANCE) 25 MG TABS tablet, Take 25 mg by mouth daily., Disp: 30 tablet, Rfl: 5 .  glipiZIDE (GLUCOTROL) 10 MG tablet, Take 1 tablet (10 mg total) by mouth 2 (two) times daily., Disp: 60 tablet, Rfl: 5 .  glucose blood (BAYER CONTOUR NEXT TEST) test strip, BAYER CONTOUR NEXT TEST (In Vitro Strip)  1 (one) strip and lancets strip and lancets check sugar twice daily for 0 days  Quantity: 100;  Refills: 12   Ordered :26-Sep-2013  Althea Charon ;  Started 26-Sep-2013 Active Comments: CONTOUR NEXT E[place]. DX: 250.00  STRIPS AND LANCETS PLEASE 30 DAY SUPPLY, Disp: , Rfl:  .  loratadine (CLARITIN) 10 MG tablet, Take 10 mg by mouth daily. AM, Disp: , Rfl:  .  metFORMIN (GLUCOPHAGE) 1000 MG tablet, Take 1 tablet (1,000 mg total) by mouth 2 (two) times daily with a meal., Disp: 60 tablet, Rfl: 5 .  valsartan (DIOVAN) 160 MG tablet, Take 1 tablet (160 mg total) by mouth daily., Disp: 30 tablet, Rfl: 5 .  pravastatin (PRAVACHOL) 20 MG tablet, TAKE 1 TABLET BY MOUTH DAILY (Patient not taking: Reported on 04/22/2016), Disp: 30 tablet, Rfl: 0  Review of Systems  Constitutional: Negative for activity change, appetite change, chills, diaphoresis, fatigue,  fever and unexpected weight change.  HENT: Negative.   Eyes: Negative.   Cardiovascular: Positive for leg swelling. Negative for chest pain and palpitations.  Endocrine: Negative for polydipsia, polyphagia and polyuria.  Musculoskeletal: Negative.   Allergic/Immunologic: Negative.   Psychiatric/Behavioral: Negative.     Social History  Substance Use Topics  . Smoking status: Never Smoker  . Smokeless tobacco: Never Used  . Alcohol use No   Objective:   BP 112/62 (BP Location: Right Arm, Patient Position: Sitting, Cuff Size: Large)   Pulse 100    Temp 98 F (36.7 C) (Oral)   Resp 16   Wt 210 lb (95.3 kg)   BMI 36.05 kg/m  Vitals:   08/23/16 1103  BP: 112/62  Pulse: 100  Resp: 16  Temp: 98 F (36.7 C)  TempSrc: Oral  Weight: 210 lb (95.3 kg)     Physical Exam  Constitutional: She appears well-developed and well-nourished.  HENT:  Head: Normocephalic and atraumatic.  Eyes: Conjunctivae are normal.  Cardiovascular: Normal rate, regular rhythm and normal heart sounds.   Pulmonary/Chest: Effort normal and breath sounds normal. No respiratory distress.  Abdominal: Soft.  Musculoskeletal: She exhibits no edema.  Skin: Skin is warm and dry.  Psychiatric: She has a normal mood and affect. Her behavior is normal. Judgment and thought content normal.        Assessment & Plan:     1. Type 2 diabetes mellitus without complication, without long-term current use of insulin (HCC) Improving. Continue current medications and plan of care. Continue diet and exercise. - POCT glycosylated hemoglobin (Hb A1C) - POCT UA - Microalbumin Results for orders placed or performed in visit on 08/23/16  POCT glycosylated hemoglobin (Hb A1C)  Result Value Ref Range   Hemoglobin A1C 8.0    Est. average glucose Bld gHb Est-mCnc 183   POCT UA - Microalbumin  Result Value Ref Range   Microalbumin Ur, POC Negative mg/L    2. Pure hypercholesterolemia Pt has not been on Pravastatin. Will recheck labs. Add statin if needed. Recheck at FU if statin needs to be added. - CBC with Differential/Platelet - Comprehensive metabolic panel - Lipid panel - TSH   3. Class 2 severe obesity due to excess calories with serious comorbidity and body mass index (BMI) of 36.0 to 36.9 in adult [facility]) Pt is working on diet and exercise. Pt lost 8 pounds since LOV. Continue healthy lifestyle changes. 4.HTN On ARB. 5.HM Gyn per Dr Gertie Fey.     Patient seen and examined by Miguel Aschoff, MD, and note scribed by Renaldo Fiddler, CMA. I have done the exam  and reviewed the above chart and it is accurate to the best of my knowledge. Development worker, community has been used in this note in any air is in the dictation or transcription are unintentional.  Wilhemena Durie, MD  [place]

## 2016-08-28 LAB — CBC WITH DIFFERENTIAL/PLATELET
BASOS ABS: 0 10*3/uL (ref 0.0–0.2)
Basos: 1 %
EOS (ABSOLUTE): 0.1 10*3/uL (ref 0.0–0.4)
Eos: 2 %
Hematocrit: 44.8 % (ref 34.0–46.6)
Hemoglobin: 15.1 g/dL (ref 11.1–15.9)
Immature Grans (Abs): 0 10*3/uL (ref 0.0–0.1)
Immature Granulocytes: 1 %
LYMPHS ABS: 1.8 10*3/uL (ref 0.7–3.1)
Lymphs: 29 %
MCH: 29.2 pg (ref 26.6–33.0)
MCHC: 33.7 g/dL (ref 31.5–35.7)
MCV: 87 fL (ref 79–97)
MONOS ABS: 0.4 10*3/uL (ref 0.1–0.9)
Monocytes: 6 %
NEUTROS ABS: 3.9 10*3/uL (ref 1.4–7.0)
Neutrophils: 61 %
PLATELETS: 204 10*3/uL (ref 150–379)
RBC: 5.17 x10E6/uL (ref 3.77–5.28)
RDW: 13.6 % (ref 12.3–15.4)
WBC: 6.4 10*3/uL (ref 3.4–10.8)

## 2016-08-28 LAB — LIPID PANEL
CHOLESTEROL TOTAL: 242 mg/dL — AB (ref 100–199)
Chol/HDL Ratio: 4.9 ratio — ABNORMAL HIGH (ref 0.0–4.4)
HDL: 49 mg/dL (ref >39)
LDL Calculated: 136 mg/dL — ABNORMAL HIGH (ref 0–99)
Triglycerides: 286 mg/dL — ABNORMAL HIGH (ref 0–149)
VLDL Cholesterol Cal: 57 mg/dL — ABNORMAL HIGH (ref 5–40)

## 2016-08-28 LAB — COMPREHENSIVE METABOLIC PANEL
A/G RATIO: 1.5 (ref 1.2–2.2)
ALK PHOS: 55 IU/L (ref 39–117)
ALT: 17 IU/L (ref 0–32)
AST: 14 IU/L (ref 0–40)
Albumin: 4.3 g/dL (ref 3.5–5.5)
BILIRUBIN TOTAL: 0.3 mg/dL (ref 0.0–1.2)
BUN / CREAT RATIO: 20 (ref 9–23)
BUN: 19 mg/dL (ref 6–24)
CHLORIDE: 97 mmol/L (ref 96–106)
CO2: 25 mmol/L (ref 18–29)
Calcium: 9.9 mg/dL (ref 8.7–10.2)
Creatinine, Ser: 0.93 mg/dL (ref 0.57–1.00)
GFR calc Af Amer: 81 mL/min/{1.73_m2} (ref >59)
GFR calc non Af Amer: 70 mL/min/{1.73_m2} (ref >59)
GLUCOSE: 223 mg/dL — AB (ref 65–99)
Globulin, Total: 2.8 g/dL (ref 1.5–4.5)
POTASSIUM: 4.8 mmol/L (ref 3.5–5.2)
Sodium: 137 mmol/L (ref 134–144)
Total Protein: 7.1 g/dL (ref 6.0–8.5)

## 2016-08-28 LAB — TSH: TSH: 6.5 u[IU]/mL — ABNORMAL HIGH (ref 0.450–4.500)

## 2016-08-31 ENCOUNTER — Telehealth: Payer: Self-pay

## 2016-08-31 MED ORDER — LEVOTHYROXINE SODIUM 25 MCG PO TABS: 25.0000 ug | ORAL_TABLET | Freq: Every day | ORAL | 3 refills | Status: DC

## 2016-08-31 MED ORDER — ROSUVASTATIN CALCIUM 10 MG PO TABS: 10.0000 mg | ORAL_TABLET | Freq: Every day | ORAL | 3 refills | Status: DC

## 2016-08-31 NOTE — Telephone Encounter (Signed)
-----   Message from Jerrol Banana., MD sent at 08/31/2016 10:43 AM EDT ----- Hypothyroid. Start Synthroid 25 g daily. Lipids also up. Would change Pravachol to Crestor 10 mg daily

## 2016-08-31 NOTE — Telephone Encounter (Signed)
Patient advised. RXs sent in-aa

## 2016-11-10 ENCOUNTER — Other Ambulatory Visit: Payer: Self-pay | Admitting: Family Medicine

## 2016-11-10 DIAGNOSIS — E119 Type 2 diabetes mellitus without complications: Secondary | ICD-10-CM

## 2016-12-13 ENCOUNTER — Other Ambulatory Visit: Payer: Self-pay | Admitting: Family Medicine

## 2016-12-13 DIAGNOSIS — E119 Type 2 diabetes mellitus without complications: Secondary | ICD-10-CM

## 2016-12-14 NOTE — Telephone Encounter (Signed)
Pharmacy requesting refills. Thanks!  

## 2016-12-23 ENCOUNTER — Ambulatory Visit (INDEPENDENT_AMBULATORY_CARE_PROVIDER_SITE_OTHER): Payer: BC Managed Care – PPO | Admitting: Family Medicine

## 2016-12-23 VITALS — BP 114/78 | HR 60 | Temp 98.1°F | Resp 16 | Wt 209.0 lb

## 2016-12-23 DIAGNOSIS — E039 Hypothyroidism, unspecified: Secondary | ICD-10-CM | POA: Diagnosis not present

## 2016-12-23 DIAGNOSIS — E119 Type 2 diabetes mellitus without complications: Secondary | ICD-10-CM

## 2016-12-23 DIAGNOSIS — E7800 Pure hypercholesterolemia, unspecified: Secondary | ICD-10-CM

## 2016-12-23 MED ORDER — PIOGLITAZONE HCL 15 MG PO TABS: 15.0000 mg | ORAL_TABLET | Freq: Every day | ORAL | 12 refills | Status: DC

## 2016-12-23 NOTE — Progress Notes (Signed)
Kim Ashley  MRN: [id number] DOB: [birth year]  Subjective:  HPI   The patient is a 56 year old female who presents for follow up of her diabetes, hyperlipidemia and hypothyoidism.  She was last seen on 08/23/16. On her last visit her A1C was 8.0.  At that time she had her microalbumin checked and her diabetic foot exam. The patient had her Pravachol changed to Crestor on the last visit and is in need of the recheck on the new medicine today.  The patient was also found to have hypothyroidism on her last visit and was started on Synthroid.  This is due to be rechecked today as well.  The patient is in the age range that it is recommended that she be checked for Hepatitis C.   The patient would like to have her Jardiance changed due to the yeast problems.  The patient also needs to have her Valsartan changed if you choose to continue it.  Patient Active Problem List   Diagnosis Date Noted  . Personal history of colonic polyps   . Benign neoplasm of ascending colon   . Benign neoplasm of sigmoid colon   . Colon polyp 10/30/2014  . Essential (primary) hypertension 10/30/2014  . HLD (hyperlipidemia) 10/30/2014  . Adiposity 10/30/2014  . Obstructive apnea 10/30/2014  . Osteopenia 10/30/2014  . Diabetes mellitus, type 2 ([place]) 10/30/2014    Past Medical History:  Diagnosis Date  . Diabetes mellitus without complication ([place])   . Environmental allergies   . Hypertension   . Wears contact lenses     Social History   Social History  . Marital status: Married    Spouse name: Yvone Neu  . Number of children: 3  . Years of education: 14   Occupational History  . [employer]    Social History Main Topics  . Smoking status: Never Smoker  . Smokeless tobacco: Never Used  . Alcohol use No  . Drug use: No  . Sexual activity: Yes   Other Topics Concern  . Not on file   Social History Narrative  . No narrative on file    Outpatient Encounter Prescriptions as  of 12/23/2016  Medication Sig Note  . glipiZIDE (GLUCOTROL) 10 MG tablet TAKE 1 TABLET(10 MG) BY MOUTH TWICE DAILY   . glucose blood (BAYER CONTOUR NEXT TEST) test strip BAYER CONTOUR NEXT TEST (In Vitro Strip)  1 (one) strip and lancets strip and lancets check sugar twice daily for 0 days  Quantity: 100;  Refills: 12   Ordered :26-Sep-2013  Althea Charon ;  Started 26-Sep-2013 Active Comments: CONTOUR NEXT [place]. DX: 250.00  STRIPS AND LANCETS PLEASE 30 DAY SUPPLY 10/30/2014: CONTOUR NEXT [place]. DX: 250.00  STRIPS AND LANCETS PLEASE 30 DAY SUPPLY Received from: [facility]  . JARDIANCE 25 MG TABS tablet TAKE 1 TABLET BY MOUTH DAILY   . levothyroxine (SYNTHROID) 25 MCG tablet Take 1 tablet (25 mcg total) by mouth daily before breakfast.   . loratadine (CLARITIN) 10 MG tablet Take 10 mg by mouth daily. AM   . metFORMIN (GLUCOPHAGE) 1000 MG tablet TAKE 1 TABLET(1000 MG) BY MOUTH TWICE DAILY WITH A MEAL   . rosuvastatin (CRESTOR) 10 MG tablet Take 1 tablet (10 mg total) by mouth daily.   . valsartan (DIOVAN) 160 MG tablet TAKE 1 TABLET(160 MG) BY MOUTH DAILY    No facility-administered encounter medications on file as of 12/23/2016.     Allergies  Allergen Reactions  .  Byetta 10 Mcg Pen [Exenatide] Nausea And Vomiting  . Lisinopril Cough  . Niacin Other (See Comments)    Joint aches, flushing  . Simvastatin Other (See Comments)    Joint aches, flushing    Review of Systems  Constitutional: Negative.   Eyes: Negative.   Respiratory: Negative.   Cardiovascular: Negative.   Gastrointestinal: Negative.   Musculoskeletal: Negative.   Skin: Negative.   Neurological: Negative.   Endo/Heme/Allergies: Negative.   Psychiatric/Behavioral: Negative.     Objective:  BP 114/78 (BP Location: Right Arm, Patient Position: Sitting, Cuff Size: Normal)   Pulse 60   Temp 98.1 F (36.7 C) (Oral)   Resp 16   Wt 209 lb (94.8 kg)   BMI 35.87 kg/m   Physical Exam  Constitutional:  She is oriented to person, place, and time and well-developed, well-nourished, and in no distress.  HENT:  Head: Normocephalic and atraumatic.  Eyes: Conjunctivae are normal. No scleral icterus.  Neck: No thyromegaly present.  Cardiovascular: Normal rate, regular rhythm and normal heart sounds.   Pulmonary/Chest: Effort normal and breath sounds normal.  Neurological: She is alert and oriented to person, place, and time. Gait normal. GCS score is 15.  Skin: Skin is warm and dry.  Psychiatric: Mood, memory, affect and judgment normal.    Assessment and Plan :  Type 2 diabetes mellitus without complication, without long-term current use of insulin (HCC)  Pure hypercholesterolemia  Hypothyroidism, unspecified type  I have done the exam and reviewed the chart and it is accurate to the best of my knowledge. Development worker, community has been used and  any errors in dictation or transcription are unintentional. Miguel Aschoff M.D. [facility]

## 2017-01-05 ENCOUNTER — Ambulatory Visit: Payer: BC Managed Care – PPO | Admitting: Family Medicine

## 2017-01-13 ENCOUNTER — Other Ambulatory Visit: Payer: Self-pay | Admitting: Family Medicine

## 2017-01-14 LAB — MICROALBUMIN / CREATININE URINE RATIO
Creatinine, Urine: 87 mg/dL (ref 20–275)
Microalb Creat Ratio: 6 ug/mg{creat}
Microalb, Ur: 0.5 mg/dL

## 2017-01-14 LAB — HEMOGLOBIN A1C
EAG (MMOL/L): 10.3 (calc)
HEMOGLOBIN A1C: 8.1 %{Hb} — AB (ref <5.7)
MEAN PLASMA GLUCOSE: 186 (calc)

## 2017-01-14 LAB — LIPID PANEL
Cholesterol: 145 mg/dL
HDL: 56 mg/dL
LDL Cholesterol (Calc): 64 mg/dL
Non-HDL Cholesterol (Calc): 89 mg/dL
Total CHOL/HDL Ratio: 2.6 (calc)
Triglycerides: 167 mg/dL — ABNORMAL HIGH

## 2017-01-14 LAB — TSH: TSH: 3.39 m[IU]/L

## 2017-01-17 ENCOUNTER — Other Ambulatory Visit: Payer: Self-pay | Admitting: Family Medicine

## 2017-01-17 DIAGNOSIS — E119 Type 2 diabetes mellitus without complications: Secondary | ICD-10-CM

## 2017-01-19 ENCOUNTER — Ambulatory Visit (INDEPENDENT_AMBULATORY_CARE_PROVIDER_SITE_OTHER): Payer: BC Managed Care – PPO | Admitting: Family Medicine

## 2017-01-19 VITALS — BP 108/64

## 2017-01-19 DIAGNOSIS — I10 Essential (primary) hypertension: Secondary | ICD-10-CM

## 2017-01-19 DIAGNOSIS — Z23 Encounter for immunization: Secondary | ICD-10-CM | POA: Diagnosis not present

## 2017-01-19 NOTE — Progress Notes (Signed)
Pt here for a BP check and flu vaccine. Pt BP was 108/64.

## 2017-04-13 ENCOUNTER — Ambulatory Visit: Payer: BC Managed Care – PPO | Admitting: Family Medicine

## 2017-04-13 ENCOUNTER — Encounter: Payer: Self-pay | Admitting: Family Medicine

## 2017-04-13 VITALS — BP 124/68 | HR 94 | Temp 98.0°F | Resp 16 | Wt 217.0 lb

## 2017-04-13 DIAGNOSIS — J329 Chronic sinusitis, unspecified: Secondary | ICD-10-CM | POA: Diagnosis not present

## 2017-04-13 DIAGNOSIS — E119 Type 2 diabetes mellitus without complications: Secondary | ICD-10-CM | POA: Diagnosis not present

## 2017-04-13 MED ORDER — AMOXICILLIN-POT CLAVULANATE 875-125 MG PO TABS: 1.0000 | ORAL_TABLET | Freq: Two times a day (BID) | ORAL | 0 refills | Status: DC

## 2017-04-13 NOTE — Progress Notes (Signed)
Patient: Kim Ashley Female    DOB: February 25, 1961   56 y.o.   MRN: [id number] Visit Date: 04/13/2017  Today's Provider: Wilhemena Durie, MD   Chief Complaint  Patient presents with  . Sinus Problem   Subjective:    HPI  Pt is here for cough and congestion. She reports that it has been going on since October intermittently. She reports that this time she has some slight green color to her nasal congestion. She has some sinus pain and pressure. No fevers but has been having spells on being cold and hot. She reports that both her ears hurt and she has some soreness in her neck. Denies shortness of breath or chest pain.    Allergies  Allergen Reactions  . Byetta 10 Mcg Pen [Exenatide] Nausea And Vomiting  . Lisinopril Cough  . Niacin Other (See Comments)    Joint aches, flushing  . Simvastatin Other (See Comments)    Joint aches, flushing     Current Outpatient Medications:  .  glipiZIDE (GLUCOTROL) 10 MG tablet, TAKE 1 TABLET(10 MG) BY MOUTH TWICE DAILY, Disp: 60 tablet, Rfl: 12 .  levothyroxine (SYNTHROID) 25 MCG tablet, Take 1 tablet (25 mcg total) by mouth daily before breakfast., Disp: 90 tablet, Rfl: 3 .  metFORMIN (GLUCOPHAGE) 1000 MG tablet, TAKE 1 TABLET(1000 MG) BY MOUTH TWICE DAILY WITH A MEAL, Disp: 60 tablet, Rfl: 5 .  pioglitazone (ACTOS) 15 MG tablet, Take 1 tablet (15 mg total) by mouth daily., Disp: 30 tablet, Rfl: 12 .  rosuvastatin (CRESTOR) 10 MG tablet, Take 1 tablet (10 mg total) by mouth daily., Disp: 90 tablet, Rfl: 3 .  glucose blood (BAYER CONTOUR NEXT TEST) test strip, BAYER CONTOUR NEXT TEST (In Vitro Strip)  1 (one) strip and lancets strip and lancets check sugar twice daily for 0 days  Quantity: 100;  Refills: 12   Ordered :26-Sep-2013  Althea Charon ;  Started 26-Sep-2013 Active Comments: CONTOUR NEXT EZ. DX: 250.00  STRIPS AND LANCETS PLEASE 30 DAY SUPPLY, Disp: , Rfl:  .  loratadine (CLARITIN) 10 MG tablet, Take 10 mg by mouth daily. AM,  Disp: , Rfl:   Review of Systems  Constitutional: Positive for fatigue.  HENT: Positive for ear pain, sinus pressure and sinus pain.   Eyes: Negative.   Respiratory: Negative.   Cardiovascular: Negative.   Gastrointestinal: Negative.   Endocrine: Negative.   Genitourinary: Negative.   Musculoskeletal: Positive for neck pain.  Skin: Negative.   Allergic/Immunologic: Negative.   Neurological: Positive for headaches.  Hematological: Negative.     Social History   Tobacco Use  . Smoking status: Never Smoker  . Smokeless tobacco: Never Used  Substance Use Topics  . Alcohol use: No   Objective:   BP 124/68 (BP Location: Left Arm, Patient Position: Sitting, Cuff Size: Large)   Pulse 94   Temp 98 F (36.7 C) (Oral)   Resp 16   Wt 217 lb (98.4 kg)   SpO2 98%   BMI 37.25 kg/m  Vitals:   04/13/17 1542  BP: 124/68  Pulse: 94  Resp: 16  Temp: 98 F (36.7 C)  TempSrc: Oral  SpO2: 98%  Weight: 217 lb (98.4 kg)     Physical Exam  Constitutional: She is oriented to person, place, and time. She appears well-developed and well-nourished.  HENT:  Head: Normocephalic and atraumatic.  Right Ear: External ear normal.  Left Ear: External ear normal.  Nose: Nose normal.  Mouth/Throat: Oropharynx is clear and moist.  Fullness in left ear   Eyes: Conjunctivae and EOM are normal. Pupils are equal, round, and reactive to light.  Neck: Normal range of motion. Neck supple.  Cardiovascular: Normal rate, regular rhythm, normal heart sounds and intact distal pulses.  Pulmonary/Chest: Effort normal and breath sounds normal.  Musculoskeletal: Normal range of motion.  Neurological: She is alert and oriented to person, place, and time. She has normal reflexes.  Skin: Skin is warm and dry.  Psychiatric: She has a normal mood and affect. Her behavior is normal. Judgment and thought content normal.        Assessment & Plan:     1. Chronic sinusitis, unspecified location  -  amoxicillin-clavulanate (AUGMENTIN) 875-125 MG tablet; Take 1 tablet by mouth 2 (two) times daily.  Dispense: 20 tablet; Refill: 0 2.TIIDM     HPI, Exam, and A&P Transcribed under the direction and in the presence of Adyn Hoes L. Cranford Mon, MD  Electronically Signed: Katina Dung, CMA  I have done the exam and reviewed the above chart and it is accurate to the best of my knowledge. Development worker, community has been used in this note in any air is in the dictation or transcription are unintentional.  Wilhemena Durie, MD  [place]

## 2017-04-20 ENCOUNTER — Ambulatory Visit: Payer: Self-pay | Admitting: Family Medicine

## 2017-04-27 ENCOUNTER — Ambulatory Visit: Payer: Self-pay | Admitting: Family Medicine

## 2017-05-04 ENCOUNTER — Ambulatory Visit: Payer: BC Managed Care – PPO | Admitting: Family Medicine

## 2017-05-04 VITALS — BP 114/72 | HR 86 | Temp 97.8°F | Resp 16 | Wt 218.0 lb

## 2017-05-04 DIAGNOSIS — E119 Type 2 diabetes mellitus without complications: Secondary | ICD-10-CM

## 2017-05-04 DIAGNOSIS — Z6836 Body mass index (BMI) 36.0-36.9, adult: Secondary | ICD-10-CM

## 2017-05-04 DIAGNOSIS — G4733 Obstructive sleep apnea (adult) (pediatric): Secondary | ICD-10-CM

## 2017-05-04 LAB — POCT GLYCOSYLATED HEMOGLOBIN (HGB A1C): Hemoglobin A1C: 8.7

## 2017-05-04 MED ORDER — PIOGLITAZONE HCL 30 MG PO TABS: 30.0000 mg | ORAL_TABLET | Freq: Every day | ORAL | 12 refills | Status: DC

## 2017-05-04 NOTE — Progress Notes (Signed)
Kim Ashley  MRN: [id number] DOB: 11/16/1960  Subjective:  HPI   The patient is a 57 year old female who presents for follow up of chronic illness,  She was last seen on 04/13/17 for an acute visit and had her last follow up for chronic issues on 12/23/16.  Diabetes-The patient was seen in August and her last A1C was done in September and it was almost at goal at 8.1.   The patient does not check her glucose at home.  She reports that maybe one time she had some symptoms of hypoglycemia and states she had been busy that day and had not eaten properly.   Once she got something to eat her symptoms subsided.     Patient Active Problem List   Diagnosis Date Noted  . Personal history of colonic polyps   . Benign neoplasm of ascending colon   . Benign neoplasm of sigmoid colon   . Colon polyp 10/30/2014  . Essential (primary) hypertension 10/30/2014  . HLD (hyperlipidemia) 10/30/2014  . Adiposity 10/30/2014  . Obstructive apnea 10/30/2014  . Osteopenia 10/30/2014  . Diabetes mellitus, type 2 ([place]) 10/30/2014    Past Medical History:  Diagnosis Date  . Diabetes mellitus without complication ([place])   . Environmental allergies   . Hypertension   . Wears contact lenses     Social History   Socioeconomic History  . Marital status: Married    Spouse name: Yvone Neu  . Number of children: 3  . Years of education: 40  . Highest education level: Not on file  Social Needs  . Financial resource strain: Not on file  . Food insecurity - worry: Not on file  . Food insecurity - inability: Not on file  . Transportation needs - medical: Not on file  . Transportation needs - non-medical: Not on file  Occupational History  . Occupation: Insurance underwriter [employer]  Tobacco Use  . Smoking status: Never Smoker  . Smokeless tobacco: Never Used  Substance and Sexual Activity  . Alcohol use: No  . Drug use: No  . Sexual activity: Yes  Other Topics Concern  . Not on file  Social History  Narrative  . Not on file    Outpatient Encounter Medications as of 05/04/2017  Medication Sig Note  . glipiZIDE (GLUCOTROL) 10 MG tablet TAKE 1 TABLET(10 MG) BY MOUTH TWICE DAILY   . glucose blood (BAYER CONTOUR NEXT TEST) test strip BAYER CONTOUR NEXT TEST (In Vitro Strip)  1 (one) strip and lancets strip and lancets check sugar twice daily for 0 days  Quantity: 100;  Refills: 12   Ordered :26-Sep-2013  Althea Charon ;  Started 26-Sep-2013 Active Comments: CONTOUR NEXT EZ. DX: 250.00  STRIPS AND LANCETS PLEASE 30 DAY SUPPLY 10/30/2014: CONTOUR NEXT EZ. DX: 250.00  STRIPS AND LANCETS PLEASE 30 DAY SUPPLY Received from: [facility]  . levothyroxine (SYNTHROID) 25 MCG tablet Take 1 tablet (25 mcg total) by mouth daily before breakfast.   . loratadine (CLARITIN) 10 MG tablet Take 10 mg by mouth daily. AM   . metFORMIN (GLUCOPHAGE) 1000 MG tablet TAKE 1 TABLET(1000 MG) BY MOUTH TWICE DAILY WITH A MEAL   . pioglitazone (ACTOS) 15 MG tablet Take 1 tablet (15 mg total) by mouth daily.   . rosuvastatin (CRESTOR) 10 MG tablet Take 1 tablet (10 mg total) by mouth daily.   . [DISCONTINUED] amoxicillin-clavulanate (AUGMENTIN) 875-125 MG tablet Take 1 tablet by mouth 2 (two) times daily.  No facility-administered encounter medications on file as of 05/04/2017.     Allergies  Allergen Reactions  . Byetta 10 Mcg Pen [Exenatide] Nausea And Vomiting  . Lisinopril Cough  . Niacin Other (See Comments)    Joint aches, flushing  . Simvastatin Other (See Comments)    Joint aches, flushing    Review of Systems  Constitutional: Negative for fever and malaise/fatigue.  Eyes: Negative.   Respiratory: Negative for cough, shortness of breath and wheezing.   Cardiovascular: Negative for chest pain, palpitations, orthopnea, claudication and leg swelling.  Gastrointestinal: Negative.   Genitourinary: Negative for frequency.  Skin: Negative.   Neurological: Negative.  Negative for  weakness.  Endo/Heme/Allergies: Negative for polydipsia.  Psychiatric/Behavioral: Negative.     Objective:  BP 114/72 (BP Location: Right Arm, Patient Position: Sitting, Cuff Size: Normal)   Pulse 86   Temp 97.8 F (36.6 C) (Oral)   Resp 16   Wt 218 lb (98.9 kg)   BMI 37.42 kg/m   Physical Exam  Constitutional: She is oriented to person, place, and time and well-developed, well-nourished, and in no distress.  Pleasant obese WF NAD.  HENT:  Head: Normocephalic and atraumatic.  Eyes: Conjunctivae are normal. Pupils are equal, round, and reactive to light.  Neck: Normal range of motion.  Cardiovascular: Normal rate, regular rhythm and normal heart sounds.  Pulmonary/Chest: Effort normal and breath sounds normal.  Abdominal: Soft.  Neurological: She is alert and oriented to person, place, and time. Gait normal. GCS score is 15.  Skin: Skin is warm and dry.  Psychiatric: Mood, memory, affect and judgment normal.    Assessment and Plan :   TIIDM A1c is down from 8.10 last visit to 8.7 today.  Patient is not watching her diet nor exercising at all.  Actos 30 mg daily and work on diet and exercise.  Recheck 3 months. Morbid obesity Health maintenance GYN exam per Dr. Gertie Fey.  I have done the exam and reviewed the chart and it is accurate to the best of my knowledge. Development worker, community has been used and  any errors in dictation or transcription are unintentional. Miguel Aschoff M.D. [facility]

## 2017-05-26 ENCOUNTER — Other Ambulatory Visit: Payer: Self-pay | Admitting: Family Medicine

## 2017-05-26 DIAGNOSIS — E119 Type 2 diabetes mellitus without complications: Secondary | ICD-10-CM

## 2017-08-26 ENCOUNTER — Other Ambulatory Visit: Payer: Self-pay | Admitting: Family Medicine

## 2017-09-01 ENCOUNTER — Ambulatory Visit: Payer: BC Managed Care – PPO | Admitting: Family Medicine

## 2017-09-01 ENCOUNTER — Encounter: Payer: Self-pay | Admitting: Family Medicine

## 2017-09-01 VITALS — BP 118/68 | HR 98 | Temp 98.5°F | Resp 16 | Ht 64.0 in | Wt 217.0 lb

## 2017-09-01 DIAGNOSIS — E119 Type 2 diabetes mellitus without complications: Secondary | ICD-10-CM | POA: Diagnosis not present

## 2017-09-01 DIAGNOSIS — Z6836 Body mass index (BMI) 36.0-36.9, adult: Secondary | ICD-10-CM

## 2017-09-01 DIAGNOSIS — G4733 Obstructive sleep apnea (adult) (pediatric): Secondary | ICD-10-CM | POA: Diagnosis not present

## 2017-09-01 LAB — POCT GLYCOSYLATED HEMOGLOBIN (HGB A1C): Hemoglobin A1C: 9.1

## 2017-09-01 MED ORDER — LANCETS MISC: 1.0000 | Freq: Every day | 3 refills | Status: AC

## 2017-09-01 MED ORDER — GLUCOSE BLOOD VI STRP
ORAL_STRIP | 3 refills | Status: AC
Start: 2017-09-01 — End: ?

## 2017-09-01 MED ORDER — DULAGLUTIDE 0.75 MG/0.5ML ~~LOC~~ SOAJ: 1.0000 "pen " | SUBCUTANEOUS | 11 refills | Status: DC

## 2017-09-01 NOTE — Progress Notes (Signed)
Patient: Kim Ashley Female    DOB: 1960/10/10   57 y.o.   MRN: [id number] Visit Date: 09/01/2017  Today's Provider: Wilhemena Durie, MD   Chief Complaint  Patient presents with  . Diabetes   Subjective:    HPI  Diabetes Mellitus Type II, Follow-up:   Lab Results  Component Value Date   HGBA1C 8.7 05/04/2017   HGBA1C 8.1 (H) 01/13/2017   HGBA1C 8.0 08/23/2016    Last seen for diabetes 4 months ago.  Management since then includes none. She reports fair compliance with treatment. She is not having side effects.  Home blood sugar records: running in the 200's   Pertinent Labs:    Component Value Date/Time   CHOL 145 01/13/2017 0901   CHOL 242 (H) 08/27/2016 0817   TRIG 167 (H) 01/13/2017 0901   HDL 56 01/13/2017 0901   HDL 49 08/27/2016 0817   LDLCALC 64 01/13/2017 0901   CREATININE 0.93 08/27/2016 0817    Wt Readings from Last 3 Encounters:  09/01/17 217 lb (98.4 kg)  05/04/17 218 lb (98.9 kg)  04/13/17 217 lb (98.4 kg)    ------------------------------------------------------------------------    Allergies  Allergen Reactions  . Byetta 10 Mcg Pen [Exenatide] Nausea And Vomiting  . Lisinopril Cough  . Niacin Other (See Comments)    Joint aches, flushing  . Simvastatin Other (See Comments)    Joint aches, flushing     Current Outpatient Medications:  .  glipiZIDE (GLUCOTROL) 10 MG tablet, TAKE 1 TABLET(10 MG) BY MOUTH TWICE DAILY, Disp: 60 tablet, Rfl: 12 .  levothyroxine (SYNTHROID, LEVOTHROID) 25 MCG tablet, TAKE 1 TABLET(25 MCG) BY MOUTH DAILY BEFORE BREAKFAST, Disp: 90 tablet, Rfl: 3 .  loratadine (CLARITIN) 10 MG tablet, Take 10 mg by mouth daily. AM, Disp: , Rfl:  .  metFORMIN (GLUCOPHAGE) 1000 MG tablet, TAKE 1 TABLET(1000 MG) BY MOUTH TWICE DAILY WITH A MEAL, Disp: 60 tablet, Rfl: 12 .  pioglitazone (ACTOS) 30 MG tablet, Take 1 tablet (30 mg total) by mouth daily., Disp: 30 tablet, Rfl: 12 .  rosuvastatin (CRESTOR) 10 MG tablet,  Take 1 tablet (10 mg total) by mouth daily., Disp: 90 tablet, Rfl: 3 .  glucose blood (BAYER CONTOUR NEXT TEST) test strip, CONTOUR NEXT EZ. Dx E11.9 Test blood sugar fasting once daily, Disp: 100 each, Rfl: 3 .  Lancets MISC, 1 each by Does not apply route daily. Dx E11.9 test once daily, Disp: 100 each, Rfl: 3  Review of Systems  Constitutional: Negative.   HENT: Negative.   Eyes: Negative.   Respiratory: Negative.   Cardiovascular: Negative.   Gastrointestinal: Negative.   Endocrine: Negative.   Genitourinary: Negative.   Musculoskeletal: Positive for neck stiffness.  Skin: Negative.   Allergic/Immunologic: Negative.   Neurological: Negative.   Hematological: Negative.   Psychiatric/Behavioral: Negative.     Social History   Tobacco Use  . Smoking status: Never Smoker  . Smokeless tobacco: Never Used  Substance Use Topics  . Alcohol use: No   Objective:   BP 118/68 (BP Location: Left Arm, Patient Position: Sitting, Cuff Size: Large)   Pulse 98   Temp 98.5 F (36.9 C) (Oral)   Resp 16   Ht 5\' 4"  (1.626 m)   Wt 217 lb (98.4 kg)   BMI 37.25 kg/m  Vitals:   09/01/17 1544  BP: 118/68  Pulse: 98  Resp: 16  Temp: 98.5 F (36.9 C)  TempSrc: Oral  Weight:  217 lb (98.4 kg)  Height: 5\' 4"  (1.626 m)     Physical Exam  Constitutional: She is oriented to person, place, and time. She appears well-developed and well-nourished.  Obese WF NAD.  HENT:  Head: Normocephalic and atraumatic.  Right Ear: External ear normal.  Left Ear: External ear normal.  Nose: Nose normal.  Eyes: Conjunctivae are normal. No scleral icterus.  Neck: No thyromegaly present.  Cardiovascular: Normal rate, regular rhythm and normal heart sounds.  Pulmonary/Chest: Effort normal and breath sounds normal.  Abdominal: Soft.  Musculoskeletal: She exhibits no edema.  Lymphadenopathy:    She has no cervical adenopathy.  Neurological: She is alert and oriented to person, place, and time.  Skin:  Skin is warm and dry.  Psychiatric: She has a normal mood and affect. Her behavior is normal. Judgment and thought content normal.        Assessment & Plan:     1. Type 2 diabetes mellitus without complication, without long-term current use of insulin (HCC)  - POCT HgB A1C - glucose blood (BAYER CONTOUR NEXT TEST) test strip; CONTOUR NEXT EZ. Dx E11.9 Test blood sugar fasting once daily  Dispense: 100 each; Refill: 3 - Lancets MISC; 1 each by Does not apply route daily. Dx E11.9 test once daily  Dispense: 100 each; Refill: 3 - Dulaglutide (TRULICITY) 0.26 VZ/8.5YI SOPN; Inject 1 pen into the skin once a week.  Dispense: 4 pen; Refill: 11 2.Obesity     I have done the exam and reviewed the above chart and it is accurate to the best of my knowledge. Development worker, community has been used in this note in any air is in the dictation or transcription are unintentional.  Wilhemena Durie, MD  [place]

## 2017-10-06 ENCOUNTER — Other Ambulatory Visit: Payer: Self-pay | Admitting: Family Medicine

## 2017-12-12 ENCOUNTER — Encounter: Payer: Self-pay | Admitting: Family Medicine

## 2017-12-12 ENCOUNTER — Ambulatory Visit: Payer: BC Managed Care – PPO | Admitting: Family Medicine

## 2017-12-12 VITALS — BP 128/76 | HR 82 | Temp 98.8°F | Resp 16 | Ht 64.0 in | Wt 215.0 lb

## 2017-12-12 DIAGNOSIS — E119 Type 2 diabetes mellitus without complications: Secondary | ICD-10-CM | POA: Diagnosis not present

## 2017-12-12 DIAGNOSIS — E039 Hypothyroidism, unspecified: Secondary | ICD-10-CM

## 2017-12-12 DIAGNOSIS — I10 Essential (primary) hypertension: Secondary | ICD-10-CM

## 2017-12-12 DIAGNOSIS — E7800 Pure hypercholesterolemia, unspecified: Secondary | ICD-10-CM

## 2017-12-12 LAB — POCT GLYCOSYLATED HEMOGLOBIN (HGB A1C): Hemoglobin A1C: 6.9 % — AB (ref 4.0–5.6)

## 2017-12-12 NOTE — Progress Notes (Signed)
Patient: Kim Ashley Female    DOB: [birth year]   57 y.o.   MRN: [id number] Visit Date: 12/12/2017  Today's Provider: Wilhemena Durie, MD   Chief Complaint  Patient presents with  . Diabetes  . Hyperlipidemia  . Hypothyroidism   Subjective:    HPI  Diabetes Mellitus Type II, Follow-up:   Lab Results  Component Value Date   HGBA1C 6.9 (A) 12/12/2017   HGBA1C 9.1 09/01/2017   HGBA1C 8.7 05/04/2017    Last seen for diabetes 3 months ago.  Management since then includes adding Trulicity. She reports good compliance with treatment. She is not having side effects.  Current symptoms include none and have been stable. Home blood sugar records: fasting range: 162 this morning  Episodes of hypoglycemia? no   Most Recent Eye Exam: due Weight trend: stable Prior visit with dietician: no Current diet: well balanced Current exercise: walking  Pertinent Labs:    Component Value Date/Time   CHOL 145 01/13/2017 0901   CHOL 242 (H) 08/27/2016 0817   TRIG 167 (H) 01/13/2017 0901   HDL 56 01/13/2017 0901   HDL 49 08/27/2016 0817   LDLCALC 64 01/13/2017 0901   CREATININE 0.93 08/27/2016 0817    Wt Readings from Last 3 Encounters:  12/12/17 215 lb (97.5 kg)  09/01/17 217 lb (98.4 kg)  05/04/17 218 lb (98.9 kg)    Lipid/Cholesterol, Follow-up:   Last seen for this3 months ago.  Management changes since that visit include no changes. . Last Lipid Panel:    Component Value Date/Time   CHOL 145 01/13/2017 0901   CHOL 242 (H) 08/27/2016 0817   TRIG 167 (H) 01/13/2017 0901   HDL 56 01/13/2017 0901   HDL 49 08/27/2016 0817   CHOLHDL 2.6 01/13/2017 0901   LDLCALC 64 01/13/2017 0901    Risk factors for vascular disease include diabetes mellitus and hypercholesterolemia  She reports good compliance with treatment. She is not having side effects.  Current symptoms include none and have been stable.  Hypothyroidism, follow up: Patient was last seen in the  office 3 months ago. No changes were made in her medications. She is currently taking levothyroxine 3mcg daily, and reports good symptom control and good compliance.   Allergies  Allergen Reactions  . Byetta 10 Mcg Pen [Exenatide] Nausea And Vomiting  . Lisinopril Cough  . Niacin Other (See Comments)    Joint aches, flushing  . Simvastatin Other (See Comments)    Joint aches, flushing     Current Outpatient Medications:  .  Dulaglutide (TRULICITY) 4.03 KV/4.2VZ SOPN, Inject 1 pen into the skin once a week., Disp: 4 pen, Rfl: 11 .  glipiZIDE (GLUCOTROL) 10 MG tablet, TAKE 1 TABLET(10 MG) BY MOUTH TWICE DAILY, Disp: 60 tablet, Rfl: 12 .  glucose blood (BAYER CONTOUR NEXT TEST) test strip, CONTOUR NEXT EZ. Dx E11.9 Test blood sugar fasting once daily, Disp: 100 each, Rfl: 3 .  Lancets MISC, 1 each by Does not apply route daily. Dx E11.9 test once daily, Disp: 100 each, Rfl: 3 .  levothyroxine (SYNTHROID, LEVOTHROID) 25 MCG tablet, TAKE 1 TABLET(25 MCG) BY MOUTH DAILY BEFORE BREAKFAST, Disp: 90 tablet, Rfl: 3 .  loratadine (CLARITIN) 10 MG tablet, Take 10 mg by mouth daily. AM, Disp: , Rfl:  .  metFORMIN (GLUCOPHAGE) 1000 MG tablet, TAKE 1 TABLET(1000 MG) BY MOUTH TWICE DAILY WITH A MEAL, Disp: 60 tablet, Rfl: 12 .  pioglitazone (ACTOS) 30 MG tablet,  Take 1 tablet (30 mg total) by mouth daily., Disp: 30 tablet, Rfl: 12 .  rosuvastatin (CRESTOR) 10 MG tablet, TAKE 1 TABLET(10 MG) BY MOUTH DAILY, Disp: 90 tablet, Rfl: 3  Review of Systems  Constitutional: Negative for activity change, appetite change, chills, diaphoresis, fatigue, fever and unexpected weight change.  HENT: Negative.   Eyes: Negative.   Respiratory: Negative for cough and shortness of breath.   Cardiovascular: Negative for chest pain, palpitations and leg swelling.  Gastrointestinal: Negative.   Endocrine: Negative.   Musculoskeletal: Negative for arthralgias, back pain, gait problem, joint swelling and myalgias.    Allergic/Immunologic: Negative.   Neurological: Negative for dizziness, weakness and headaches.  Psychiatric/Behavioral: Negative.     Social History   Tobacco Use  . Smoking status: Never Smoker  . Smokeless tobacco: Never Used  Substance Use Topics  . Alcohol use: No   Objective:   BP 128/76 (BP Location: Left Arm, Patient Position: Sitting, Cuff Size: Large)   Pulse 82   Temp 98.8 F (37.1 C)   Resp 16   Ht 5\' 4"  (1.626 m)   Wt 215 lb (97.5 kg)   SpO2 98%   BMI 36.90 kg/m  Vitals:   12/12/17 0839  BP: 128/76  Pulse: 82  Resp: 16  Temp: 98.8 F (37.1 C)  SpO2: 98%  Weight: 215 lb (97.5 kg)  Height: 5\' 4"  (1.626 m)     Physical Exam  Constitutional: She is oriented to person, place, and time. She appears well-developed and well-nourished.  HENT:  Head: Normocephalic and atraumatic.  Right Ear: External ear normal.  Left Ear: External ear normal.  Nose: Nose normal.  Eyes: Conjunctivae are normal. No scleral icterus.  Neck: No thyromegaly present.  Cardiovascular: Normal rate, regular rhythm and normal heart sounds.  Pulmonary/Chest: Effort normal and breath sounds normal.  Abdominal: Soft.  Musculoskeletal: She exhibits no edema.  Neurological: She is alert and oriented to person, place, and time.  Skin: Skin is warm and dry.  Psychiatric: She has a normal mood and affect. Her behavior is normal. Judgment and thought content normal.        Assessment & Plan:     1. Type 2 diabetes mellitus without complication, without long-term current use of insulin (HCC) RTC 4 months. - POCT glycosylated hemoglobin (Hb A1C) - CBC with Differential/Platelet  2. Essential (primary) hypertension  - Comprehensive metabolic panel  3. Hypothyroidism, unspecified type  - CBC with Differential/Platelet - TSH  4. Pure hypercholesterolemia  - Lipid panel 5.Obesity Lifestyle stressed.     I have done the exam and reviewed the above chart and it is accurate to  the best of my knowledge. Development worker, community has been used in this note in any air is in the dictation or transcription are unintentional.  Wilhemena Durie, MD  [place]

## 2017-12-13 LAB — CBC WITH DIFFERENTIAL/PLATELET
BASOS ABS: 0 10*3/uL (ref 0.0–0.2)
Basos: 0 %
EOS (ABSOLUTE): 0.1 10*3/uL (ref 0.0–0.4)
Eos: 1 %
HEMOGLOBIN: 13.9 g/dL (ref 11.1–15.9)
Hematocrit: 41.5 % (ref 34.0–46.6)
IMMATURE GRANS (ABS): 0 10*3/uL (ref 0.0–0.1)
Immature Granulocytes: 0 %
LYMPHS: 28 %
Lymphocytes Absolute: 1.5 10*3/uL (ref 0.7–3.1)
MCH: 29.4 pg (ref 26.6–33.0)
MCHC: 33.5 g/dL (ref 31.5–35.7)
MCV: 88 fL (ref 79–97)
MONOCYTES: 6 %
Monocytes Absolute: 0.3 10*3/uL (ref 0.1–0.9)
Neutrophils Absolute: 3.6 10*3/uL (ref 1.4–7.0)
Neutrophils: 65 %
Platelets: 190 10*3/uL (ref 150–450)
RBC: 4.73 x10E6/uL (ref 3.77–5.28)
RDW: 14.5 % (ref 12.3–15.4)
WBC: 5.5 10*3/uL (ref 3.4–10.8)

## 2017-12-13 LAB — LIPID PANEL
CHOLESTEROL TOTAL: 112 mg/dL (ref 100–199)
Chol/HDL Ratio: 2 ratio (ref 0.0–4.4)
HDL: 55 mg/dL (ref >39)
LDL CALC: 36 mg/dL (ref 0–99)
TRIGLYCERIDES: 107 mg/dL (ref 0–149)
VLDL Cholesterol Cal: 21 mg/dL (ref 5–40)

## 2017-12-13 LAB — COMPREHENSIVE METABOLIC PANEL
ALK PHOS: 47 IU/L (ref 39–117)
ALT: 14 IU/L (ref 0–32)
AST: 14 IU/L (ref 0–40)
Albumin/Globulin Ratio: 1.8 (ref 1.2–2.2)
Albumin: 4.4 g/dL (ref 3.5–5.5)
BUN/Creatinine Ratio: 10 (ref 9–23)
BUN: 7 mg/dL (ref 6–24)
Bilirubin Total: 0.4 mg/dL (ref 0.0–1.2)
CO2: 21 mmol/L (ref 20–29)
CREATININE: 0.71 mg/dL (ref 0.57–1.00)
Calcium: 9.5 mg/dL (ref 8.7–10.2)
Chloride: 104 mmol/L (ref 96–106)
GFR calc Af Amer: 111 mL/min/{1.73_m2} (ref >59)
GFR calc non Af Amer: 96 mL/min/{1.73_m2} (ref >59)
GLOBULIN, TOTAL: 2.5 g/dL (ref 1.5–4.5)
Glucose: 131 mg/dL — ABNORMAL HIGH (ref 65–99)
POTASSIUM: 4 mmol/L (ref 3.5–5.2)
SODIUM: 144 mmol/L (ref 134–144)
Total Protein: 6.9 g/dL (ref 6.0–8.5)

## 2017-12-13 LAB — TSH: TSH: 3.98 u[IU]/mL (ref 0.450–4.500)

## 2018-01-03 LAB — HM DIABETES EYE EXAM

## 2018-02-06 ENCOUNTER — Other Ambulatory Visit: Payer: Self-pay | Admitting: Family Medicine

## 2018-02-06 DIAGNOSIS — E119 Type 2 diabetes mellitus without complications: Secondary | ICD-10-CM

## 2018-03-16 ENCOUNTER — Other Ambulatory Visit: Payer: Self-pay | Admitting: Family Medicine

## 2018-03-16 DIAGNOSIS — E119 Type 2 diabetes mellitus without complications: Secondary | ICD-10-CM

## 2018-04-17 ENCOUNTER — Encounter: Payer: Self-pay | Admitting: Family Medicine

## 2018-04-17 ENCOUNTER — Ambulatory Visit: Payer: BC Managed Care – PPO | Admitting: Family Medicine

## 2018-04-17 VITALS — BP 126/78 | HR 84 | Temp 98.6°F | Resp 16 | Ht 64.0 in | Wt 215.0 lb

## 2018-04-17 DIAGNOSIS — E119 Type 2 diabetes mellitus without complications: Secondary | ICD-10-CM

## 2018-04-17 DIAGNOSIS — E66812 Obesity, class 2: Secondary | ICD-10-CM

## 2018-04-17 DIAGNOSIS — E039 Hypothyroidism, unspecified: Secondary | ICD-10-CM | POA: Diagnosis not present

## 2018-04-17 DIAGNOSIS — Z23 Encounter for immunization: Secondary | ICD-10-CM

## 2018-04-17 DIAGNOSIS — I10 Essential (primary) hypertension: Secondary | ICD-10-CM | POA: Diagnosis not present

## 2018-04-17 DIAGNOSIS — Z6836 Body mass index (BMI) 36.0-36.9, adult: Secondary | ICD-10-CM

## 2018-04-17 DIAGNOSIS — E7800 Pure hypercholesterolemia, unspecified: Secondary | ICD-10-CM

## 2018-04-17 LAB — POCT GLYCOSYLATED HEMOGLOBIN (HGB A1C): HEMOGLOBIN A1C: 6.9 % — AB (ref 4.0–5.6)

## 2018-04-17 NOTE — Progress Notes (Signed)
Patient: Kim Ashley Female    DOB: [birth year]   57 y.o.   MRN: [id number] Visit Date: 04/17/2018  Today's Provider: Wilhemena Durie, MD   Chief Complaint  Patient presents with  . Diabetes  . Hyperlipidemia  . Hypothyroidism   Subjective:     HPI   Diabetes Mellitus Type II, Follow-up:   Lab Results  Component Value Date   HGBA1C 6.9 (A) 04/17/2018   HGBA1C 6.9 (A) 12/12/2017   HGBA1C 9.1 09/01/2017    Last seen for diabetes 4 months ago.  Management since then includes no changes. She reports good compliance with treatment. She is not having side effects.  Current symptoms include none and have been stable. Home blood sugar records: fasting range: 130s  Episodes of hypoglycemia? no   Current Insulin Regimen: once weekly. Most Recent Eye Exam: 01/2018 Weight trend: stable Prior visit with dietician: no Current diet: well balanced Current exercise: no regular exercise  Pertinent Labs:    Component Value Date/Time   CHOL 112 12/12/2017 0944   TRIG 107 12/12/2017 0944   HDL 55 12/12/2017 0944   LDLCALC 36 12/12/2017 0944   LDLCALC 64 01/13/2017 0901   CREATININE 0.71 12/12/2017 0944    Wt Readings from Last 3 Encounters:  04/17/18 215 lb (97.5 kg)  12/12/17 215 lb (97.5 kg)  09/01/17 217 lb (98.4 kg)    Lipid/Cholesterol, Follow-up:   Last seen for this4 months ago.  Management changes since that visit include no changes. . Last Lipid Panel:    Component Value Date/Time   CHOL 112 12/12/2017 0944   TRIG 107 12/12/2017 0944   HDL 55 12/12/2017 0944   CHOLHDL 2.0 12/12/2017 0944   CHOLHDL 2.6 01/13/2017 0901   LDLCALC 36 12/12/2017 0944   LDLCALC 64 01/13/2017 0901    Risk factors for vascular disease include diabetes mellitus  She reports good compliance with treatment. She is not having side effects.  Current symptoms include none and have been stable.  Hypothyroidism, follow up: Patient was last seen in the office 4 months  ago. No changes were made in her medications. She is currently taking levothyroxine 33mcg daily, and reports good compliance, and good symptom control. Lab Results  Component Value Date   TSH 3.980 12/12/2017      Allergies  Allergen Reactions  . Byetta 10 Mcg Pen [Exenatide] Nausea And Vomiting  . Lisinopril Cough  . Niacin Other (See Comments)    Joint aches, flushing  . Simvastatin Other (See Comments)    Joint aches, flushing     Current Outpatient Medications:  .  Dulaglutide (TRULICITY) 4.25 ZD/6.3OV SOPN, Inject 1 pen into the skin once a week., Disp: 4 pen, Rfl: 11 .  glipiZIDE (GLUCOTROL) 10 MG tablet, TAKE 1 TABLET(10 MG) BY MOUTH TWICE DAILY, Disp: 60 tablet, Rfl: 11 .  glucose blood (BAYER CONTOUR NEXT TEST) test strip, CONTOUR NEXT EZ. Dx E11.9 Test blood sugar fasting once daily, Disp: 100 each, Rfl: 3 .  Lancets MISC, 1 each by Does not apply route daily. Dx E11.9 test once daily, Disp: 100 each, Rfl: 3 .  levothyroxine (SYNTHROID, LEVOTHROID) 25 MCG tablet, TAKE 1 TABLET(25 MCG) BY MOUTH DAILY BEFORE BREAKFAST, Disp: 90 tablet, Rfl: 3 .  loratadine (CLARITIN) 10 MG tablet, Take 10 mg by mouth daily. AM, Disp: , Rfl:  .  metFORMIN (GLUCOPHAGE) 1000 MG tablet, TAKE 1 TABLET(1000 MG) BY MOUTH TWICE DAILY WITH A MEAL, Disp: 60  tablet, Rfl: 12 .  pioglitazone (ACTOS) 30 MG tablet, Take 1 tablet (30 mg total) by mouth daily., Disp: 30 tablet, Rfl: 12 .  rosuvastatin (CRESTOR) 10 MG tablet, TAKE 1 TABLET(10 MG) BY MOUTH DAILY, Disp: 90 tablet, Rfl: 3  Review of Systems  Constitutional: Negative.   Respiratory: Negative.   Cardiovascular: Negative.   Endocrine: Negative.   Musculoskeletal: Negative.   Neurological: Negative.   Psychiatric/Behavioral: Negative.     Social History   Tobacco Use  . Smoking status: Never Smoker  . Smokeless tobacco: Never Used  Substance Use Topics  . Alcohol use: No      Objective:   BP 126/78 (BP Location: Left Arm, Patient  Position: Sitting, Cuff Size: Large)   Pulse 84   Temp 98.6 F (37 C)   Resp 16   Ht 5\' 4"  (1.626 m)   Wt 215 lb (97.5 kg)   SpO2 99%   BMI 36.90 kg/m  Vitals:   04/17/18 0853  BP: 126/78  Pulse: 84  Resp: 16  Temp: 98.6 F (37 C)  SpO2: 99%  Weight: 215 lb (97.5 kg)  Height: 5\' 4"  (1.626 m)     Physical Exam Constitutional:      Appearance: She is well-developed.  HENT:     Head: Normocephalic and atraumatic.     Right Ear: External ear normal.     Left Ear: External ear normal.     Nose: Nose normal.  Eyes:     General: No scleral icterus.    Conjunctiva/sclera: Conjunctivae normal.  Neck:     Thyroid: No thyromegaly.  Cardiovascular:     Rate and Rhythm: Normal rate and regular rhythm.     Heart sounds: Normal heart sounds.  Pulmonary:     Effort: Pulmonary effort is normal.     Breath sounds: Normal breath sounds.  Abdominal:     Palpations: Abdomen is soft.  Skin:    General: Skin is warm and dry.  Neurological:     Mental Status: She is alert and oriented to person, place, and time.  Psychiatric:        Behavior: Behavior normal.        Thought Content: Thought content normal.        Judgment: Judgment normal.         Assessment & Plan    1. Type 2 diabetes mellitus without complication, without long-term current use of insulin (HCC) Stable control of 6.9 A1c. RTC 4-5 nmonths - POCT glycosylated hemoglobin (Hb A1C)--6.9 today  2. Need for influenza vaccination  - Flu Vaccine QUAD 6+ mos PF IM (Fluarix Quad PF)  3. Essential (primary) hypertension Controlled  4. Hypothyroidism, unspecified type   5. Pure hypercholesterolemia 6.Obesity With diabetes hypertension hyperlipidemia.  I have done the exam and reviewed the chart and it is accurate to the best of my knowledge. Development worker, community has been used and  any errors in dictation or transcription are unintentional. Miguel Aschoff M.D. [place], MD  [facility]

## 2018-05-10 ENCOUNTER — Other Ambulatory Visit: Payer: Self-pay | Admitting: Family Medicine

## 2018-05-10 DIAGNOSIS — E119 Type 2 diabetes mellitus without complications: Secondary | ICD-10-CM

## 2018-06-08 ENCOUNTER — Other Ambulatory Visit: Payer: Self-pay | Admitting: Family Medicine

## 2018-06-08 DIAGNOSIS — E119 Type 2 diabetes mellitus without complications: Secondary | ICD-10-CM

## 2018-08-20 ENCOUNTER — Other Ambulatory Visit: Payer: Self-pay | Admitting: Family Medicine

## 2018-08-20 DIAGNOSIS — E119 Type 2 diabetes mellitus without complications: Secondary | ICD-10-CM

## 2018-09-01 ENCOUNTER — Other Ambulatory Visit: Payer: Self-pay | Admitting: Family Medicine

## 2018-09-01 MED ORDER — LEVOTHYROXINE SODIUM 25 MCG PO TABS: 25.0000 ug | ORAL_TABLET | Freq: Every day | ORAL | 3 refills | Status: DC

## 2018-09-01 NOTE — Telephone Encounter (Signed)
[facility] faxed refill request for the following medications: ° °levothyroxine (SYNTHROID, LEVOTHROID) 25 MCG tablet ° ° °Please advise. °

## 2018-09-01 NOTE — Telephone Encounter (Signed)
Please review. Thanks!  

## 2018-09-18 ENCOUNTER — Ambulatory Visit: Payer: Self-pay | Admitting: Family Medicine

## 2018-10-11 ENCOUNTER — Other Ambulatory Visit: Payer: Self-pay | Admitting: Family Medicine

## 2018-10-11 NOTE — Telephone Encounter (Signed)
Please review

## 2018-10-17 ENCOUNTER — Other Ambulatory Visit: Payer: Self-pay | Admitting: Family Medicine

## 2018-10-17 DIAGNOSIS — E119 Type 2 diabetes mellitus without complications: Secondary | ICD-10-CM

## 2018-12-14 ENCOUNTER — Other Ambulatory Visit: Payer: Self-pay | Admitting: Family Medicine

## 2018-12-14 DIAGNOSIS — E119 Type 2 diabetes mellitus without complications: Secondary | ICD-10-CM

## 2019-01-09 LAB — HM DIABETES EYE EXAM

## 2019-02-12 ENCOUNTER — Other Ambulatory Visit: Payer: Self-pay | Admitting: Family Medicine

## 2019-02-12 DIAGNOSIS — E119 Type 2 diabetes mellitus without complications: Secondary | ICD-10-CM

## 2019-02-14 ENCOUNTER — Other Ambulatory Visit: Payer: Self-pay | Admitting: Family Medicine

## 2019-02-14 DIAGNOSIS — E119 Type 2 diabetes mellitus without complications: Secondary | ICD-10-CM

## 2019-03-12 ENCOUNTER — Other Ambulatory Visit: Payer: Self-pay | Admitting: Family Medicine

## 2019-03-12 DIAGNOSIS — E119 Type 2 diabetes mellitus without complications: Secondary | ICD-10-CM

## 2019-03-19 ENCOUNTER — Other Ambulatory Visit: Payer: Self-pay

## 2019-03-19 DIAGNOSIS — Z20822 Contact with and (suspected) exposure to covid-19: Secondary | ICD-10-CM

## 2019-03-20 LAB — INPATIENT

## 2019-03-20 LAB — NOVEL CORONAVIRUS, NAA: SARS-CoV-2, NAA: NOT DETECTED

## 2019-04-09 ENCOUNTER — Other Ambulatory Visit: Payer: Self-pay | Admitting: Family Medicine

## 2019-04-09 DIAGNOSIS — E119 Type 2 diabetes mellitus without complications: Secondary | ICD-10-CM

## 2019-04-09 MED ORDER — TRULICITY 0.75 MG/0.5ML ~~LOC~~ SOAJ: SUBCUTANEOUS | 0 refills | Status: DC

## 2019-04-09 NOTE — Telephone Encounter (Signed)
[facility] faxed refill request for the following medications:  TRULICITY [id number] SOPN    Please advise.  Thanks, [employer]

## 2019-04-16 ENCOUNTER — Other Ambulatory Visit: Payer: Self-pay | Admitting: Family Medicine

## 2019-04-16 DIAGNOSIS — E119 Type 2 diabetes mellitus without complications: Secondary | ICD-10-CM

## 2019-04-16 MED ORDER — GLIPIZIDE 10 MG PO TABS: ORAL_TABLET | ORAL | 0 refills | Status: DC

## 2019-04-16 NOTE — Telephone Encounter (Signed)
[facility] faxed refill request for the following medications:  glipiZIDE (GLUCOTROL) 10 MG tablet   Please advise.  Thanks, [employer]

## 2019-05-11 ENCOUNTER — Other Ambulatory Visit: Payer: Self-pay | Admitting: Family Medicine

## 2019-05-11 DIAGNOSIS — E119 Type 2 diabetes mellitus without complications: Secondary | ICD-10-CM

## 2019-05-11 NOTE — Telephone Encounter (Signed)
Requested medication (s) are due for refill today: yes  Requested medication (s) are on the active medication list: yes  Last refill: 04/09/2019  Future visit scheduled: no  Notes to clinic:  no valid encounter within last 6 months No A1c within last days   Requested Prescriptions  Pending Prescriptions Disp Refills   TRULICITY [id number] SOPN [Pharmacy Med Name: TRULICITY 0.75MG /0.5ML SDP 0.5ML] 2 mL 0    Sig: INJECT THE CONTENTS OF 1 PEN( 0.75 MG) UNDER THE SKIN ONCE A WEEK      Endocrinology:  Diabetes - GLP-1 Receptor Agonists Failed - 05/11/2019  3:19 AM      Failed - HBA1C is between 0 and 7.9 and within 180 days    Hemoglobin A1C  Date Value Ref Range Status  04/17/2018 6.9 (A) 4.0 - 5.6 % Final   Hgb A1c MFr Bld  Date Value Ref Range Status  01/13/2017 8.1 (H) <5.7 % of total Hgb Final    Comment:    For someone without known diabetes, a hemoglobin A1c value of 6.5% or greater indicates that they may have  diabetes and this should be confirmed with a follow-up  test. . For someone with known diabetes, a value <7% indicates  that their diabetes is well controlled and a value  greater than or equal to 7% indicates suboptimal  control. A1c targets should be individualized based on  duration of diabetes, age, comorbid conditions, and  other considerations. . Currently, no consensus exists regarding use of hemoglobin A1c for diagnosis of diabetes for children. .           Failed - Valid encounter within last 6 months    Recent Outpatient Visits           1 year ago Type 2 diabetes mellitus without complication, without long-term current use of insulin [facility])   [facility] [place] Jerrol Banana., [place]   1 year ago Type 2 diabetes mellitus without complication, without long-term current use of insulin [facility])   [facility] Jerrol Banana., MD   1 year ago Type 2 diabetes mellitus without complication, without long-term current  use of insulin [facility])   [facility] Jerrol Banana., MD   2 years ago Obstructive apnea   [facility] Jerrol Banana., MD   2 years ago Chronic sinusitis, unspecified location   [facility] Jerrol Banana., MD

## 2019-05-13 ENCOUNTER — Other Ambulatory Visit: Payer: Self-pay | Admitting: Family Medicine

## 2019-05-13 DIAGNOSIS — E119 Type 2 diabetes mellitus without complications: Secondary | ICD-10-CM

## 2019-06-08 ENCOUNTER — Other Ambulatory Visit: Payer: Self-pay | Admitting: Family Medicine

## 2019-06-08 DIAGNOSIS — E119 Type 2 diabetes mellitus without complications: Secondary | ICD-10-CM

## 2019-06-10 ENCOUNTER — Other Ambulatory Visit: Payer: Self-pay | Admitting: Family Medicine

## 2019-06-10 DIAGNOSIS — E119 Type 2 diabetes mellitus without complications: Secondary | ICD-10-CM

## 2019-06-10 NOTE — Telephone Encounter (Signed)
Requested medication (s) are due for refill today: yes  Requested medication (s) are on the active medication list: yes  Last refill:  06/08/18  Future visit scheduled: no  Notes to clinic:  > 3 months overdue for appt   Requested Prescriptions  Pending Prescriptions Disp Refills   metFORMIN (GLUCOPHAGE) 1000 MG tablet [Pharmacy Med Name: METFORMIN 1000MG TABLETS] 60 tablet 12    Sig: TAKE 1 TABLET(1000 MG) BY MOUTH TWICE DAILY WITH A MEAL      Endocrinology:  Diabetes - Biguanides Failed - 06/10/2019  8:27 AM      Failed - Cr in normal range and within 360 days    Creatinine, Ser  Date Value Ref Range Status  12/12/2017 0.71 0.57 - 1.00 mg/dL Final   Creatinine, Urine  Date Value Ref Range Status  01/13/2017 87 20 - 275 mg/dL Final          Failed - HBA1C is between 0 and 7.9 and within 180 days    Hemoglobin A1C  Date Value Ref Range Status  04/17/2018 6.9 (A) 4.0 - 5.6 % Final   Hgb A1c MFr Bld  Date Value Ref Range Status  01/13/2017 8.1 (H) <5.7 % of total Hgb Final    Comment:    For someone without known diabetes, a hemoglobin A1c value of 6.5% or greater indicates that they may have  diabetes and this should be confirmed with a follow-up  test. . For someone with known diabetes, a value <7% indicates  that their diabetes is well controlled and a value  greater than or equal to 7% indicates suboptimal  control. A1c targets should be individualized based on  duration of diabetes, age, comorbid conditions, and  other considerations. . Currently, no consensus exists regarding use of hemoglobin A1c for diagnosis of diabetes for children. .           Failed - eGFR in normal range and within 360 days    GFR calc Af Amer  Date Value Ref Range Status  12/12/2017 111 >59 mL/min/1.73 Final   GFR calc non Af Amer  Date Value Ref Range Status  12/12/2017 96 >59 mL/min/1.73 Final          Failed - Valid encounter within last 6 months    Recent Outpatient  Visits           1 year ago Type 2 diabetes mellitus without complication, without long-term current use of insulin (HCC)   [facility] Gilbert, Richard L Jr., MD   1 year ago Type 2 diabetes mellitus without complication, without long-term current use of insulin (HCC)   [facility] Gilbert, Richard L Jr., MD   1 year ago Type 2 diabetes mellitus without complication, without long-term current use of insulin (HCC)   [facility] Gilbert, Richard L Jr., MD   2 years ago Obstructive apnea   [facility] Gilbert, Richard L Jr., MD   2 years ago Chronic sinusitis, unspecified location   [facility] Gilbert, Richard L Jr., MD                   

## 2019-06-11 NOTE — Telephone Encounter (Signed)
Called to schedule patient for a visit with PCP, left voicemail for call back.

## 2019-06-11 NOTE — Telephone Encounter (Signed)
No visit or A1C since 04/2018

## 2019-07-12 ENCOUNTER — Other Ambulatory Visit: Payer: Self-pay | Admitting: Family Medicine

## 2019-07-12 DIAGNOSIS — E119 Type 2 diabetes mellitus without complications: Secondary | ICD-10-CM

## 2019-08-06 ENCOUNTER — Ambulatory Visit: Payer: BC Managed Care – PPO | Admitting: Family Medicine

## 2019-08-08 ENCOUNTER — Other Ambulatory Visit: Payer: Self-pay | Admitting: Family Medicine

## 2019-08-08 DIAGNOSIS — E119 Type 2 diabetes mellitus without complications: Secondary | ICD-10-CM

## 2019-08-08 NOTE — Telephone Encounter (Signed)
Requested Prescriptions  Pending Prescriptions Disp Refills  . metFORMIN (GLUCOPHAGE) 1000 MG tablet [Pharmacy Med Name: METFORMIN 1000MG TABLETS] 60 tablet 0    Sig: TAKE 1 TABLET(1000 MG) BY MOUTH TWICE DAILY WITH A MEAL     Endocrinology:  Diabetes - Biguanides Failed - 08/08/2019  3:20 AM      Failed - Cr in normal range and within 360 days    Creatinine, Ser  Date Value Ref Range Status  12/12/2017 0.71 0.57 - 1.00 mg/dL Final   Creatinine, Urine  Date Value Ref Range Status  01/13/2017 87 20 - 275 mg/dL Final         Failed - HBA1C is between 0 and 7.9 and within 180 days    Hemoglobin A1C  Date Value Ref Range Status  04/17/2018 6.9 (A) 4.0 - 5.6 % Final   Hgb A1c MFr Bld  Date Value Ref Range Status  01/13/2017 8.1 (H) <5.7 % of total Hgb Final    Comment:    For someone without known diabetes, a hemoglobin A1c value of 6.5% or greater indicates that they may have  diabetes and this should be confirmed with a follow-up  test. . For someone with known diabetes, a value <7% indicates  that their diabetes is well controlled and a value  greater than or equal to 7% indicates suboptimal  control. A1c targets should be individualized based on  duration of diabetes, age, comorbid conditions, and  other considerations. . Currently, no consensus exists regarding use of hemoglobin A1c for diagnosis of diabetes for children. .          Failed - eGFR in normal range and within 360 days    GFR calc Af Amer  Date Value Ref Range Status  12/12/2017 111 >59 mL/min/1.73 Final   GFR calc non Af Amer  Date Value Ref Range Status  12/12/2017 96 >59 mL/min/1.73 Final         Failed - Valid encounter within last 6 months    Recent Outpatient Visits          1 year ago Type 2 diabetes mellitus without complication, without long-term current use of insulin (HCC)   [facility] Gilbert, Richard L Jr., MD   1 year ago Type 2 diabetes mellitus without complication,  without long-term current use of insulin (HCC)   [facility] Gilbert, Richard L Jr., MD   1 year ago Type 2 diabetes mellitus without complication, without long-term current use of insulin (HCC)   [facility] Gilbert, Richard L Jr., MD   2 years ago Obstructive apnea   [facility] Gilbert, Richard L Jr., MD   2 years ago Chronic sinusitis, unspecified location   [facility] Gilbert, Richard L Jr., MD      Future Appointments            In 1 week Gilbert, Richard L Jr., MD [facility]           Medication refill authorized to get patient to her next appt.  Looks as though her appt on 08/16/2019 needs to be rescheduled per the note in appt desk.  

## 2019-08-15 NOTE — Progress Notes (Signed)
Established patient visit     Patient: Kim Ashley   DOB: 1960-11-11   59 y.o. Female  MRN: [id number] Visit Date: 08/16/2019  Today's healthcare provider: Wilhemena Durie, MD  Subjective:    Chief Complaint  Patient presents with  . Diabetes Mellitus  . Hypertension   HPI  Patient comes in today for follow-up after a year and a half.  She has not had her Covid vaccines.  She has not followed up due to lack of interest evidently.  She states she is now motivated to try to follow-up on her chronic medical problems.  She has been taking her Metformin but has run out of her other medications.  She has no complaints. Diabetes Mellitus Type II, Follow-up  Lab Results  Component Value Date   HGBA1C 6.9 (A) 04/17/2018   HGBA1C 6.9 (A) 12/12/2017   HGBA1C 9.1 09/01/2017   Last seen for diabetes over 1 years ago.  Management since then includes no changes, stable. She reports excellent compliance with treatment. She is not having side effects.  Symptoms: No fatigue No foot ulcerations No appetite changes No nausea No paresthesia (numbness or tingling) of the feet  Yes polydipsia (excessive thirst) Yes polyuria (frequent urination) No visual disturbances  No vomiting  Home blood sugar records: none  Episodes of hypoglycemia? No    Current insulin regiment: none Most Recent Eye Exam:  Current exercise: none Current diet habits: well balanced  Pertinent Labs:    Component Value Date/Time   CHOL 112 12/12/2017 0944   TRIG 107 12/12/2017 0944   HDL 55 12/12/2017 0944   LDLCALC 36 12/12/2017 0944   LDLCALC 64 01/13/2017 0901   NA 144 12/12/2017 0944   K 4.0 12/12/2017 0944   ALT 14 12/12/2017 0944   AST 14 12/12/2017 0944   CREATININE 0.71 12/12/2017 0944   Wt Readings from Last 3 Encounters:  08/16/19 223 lb (101.2 kg)  04/17/18 215 lb (97.5 kg)  12/12/17 215 lb (97.5 kg)     ----------------------------------------------------------------------------------------------------------------------  Hypertension, follow-up  BP Readings from Last 3 Encounters:  08/16/19 136/80  04/17/18 126/78  12/12/17 128/76   Wt Readings from Last 3 Encounters:  08/16/19 223 lb (101.2 kg)  04/17/18 215 lb (97.5 kg)  12/12/17 215 lb (97.5 kg)   She was last seen for hypertension 04/17/2018.  BP at that visit was 126/78. Management since that visit includes; controlled. She reports excellent compliance with treatment. She is not having side effects.  She is not exercising. She is not adherent to low salt diet.   Outside blood pressures are . Symptoms: No chest pain No chest pressure/discomfort No dyspnea (difficulty breathing) Yes lower extremity edema No orthopnea No palpitations No paroxysmal nocturnal dyspnea  No syncope  She does not smoke. She is following a Regular diet. Use of agents associated with hypertension: none.   Last basic metabolic panel Lab Results  Component Value Date   GLUCOSE 131 (H) 12/12/2017   NA 144 12/12/2017   K 4.0 12/12/2017   CL 104 12/12/2017   CO2 21 12/12/2017   BUN 7 12/12/2017   CREATININE 0.71 12/12/2017   GFRNONAA 96 12/12/2017   GFRAA 111 12/12/2017   CALCIUM 9.5 12/12/2017   Last lipids Lab Results  Component Value Date   CHOL 112 12/12/2017   HDL 55 12/12/2017   LDLCALC 36 12/12/2017   TRIG 107 12/12/2017   CHOLHDL 2.0 12/12/2017    The ASCVD Risk score Mikey Bussing  DC Jr., et al., 2013) failed to calculate for the following reasons:   The valid total cholesterol range is 130 to 320 mg/dL   ----------------------------------------------------------------------------------------------------------------------    Past Medical History:  Diagnosis Date  . Diabetes mellitus without complication ([place])   . Environmental allergies   . Hypertension   . Wears contact lenses        Medications: Outpatient  Medications Prior to Visit  Medication Sig  . Dulaglutide (TRULICITY) [id number] SOPN INJECT THE CONTENTS OF 1 PEN( 0.75 MG) UNDER THE SKIN ONCE A WEEK  . glipiZIDE (GLUCOTROL) 10 MG tablet TAKE 1 TABLET(10 MG) BY MOUTH TWICE DAILY  . glucose blood (BAYER CONTOUR NEXT TEST) test strip CONTOUR NEXT EZ. Dx E11.9 Test blood sugar fasting once daily  . Lancets MISC 1 each by Does not apply route daily. Dx E11.9 test once daily  . levothyroxine (SYNTHROID) 25 MCG tablet Take 1 tablet (25 mcg total) by mouth daily before breakfast.  . loratadine (CLARITIN) 10 MG tablet Take 10 mg by mouth daily. AM  . metFORMIN (GLUCOPHAGE) 1000 MG tablet TAKE 1 TABLET(1000 MG) BY MOUTH TWICE DAILY WITH A MEAL  . pioglitazone (ACTOS) 30 MG tablet TAKE 1 TABLET(30 MG) BY MOUTH DAILY  . rosuvastatin (CRESTOR) 10 MG tablet TAKE 1 TABLET(10 MG) BY MOUTH DAILY (Patient not taking: Reported on 08/16/2019)   No facility-administered medications prior to visit.    Review of Systems  Constitutional: Negative.   HENT: Negative.   Eyes: Negative.   Respiratory: Negative.   Cardiovascular: Negative.   Gastrointestinal: Negative.   Endocrine: Negative.   Allergic/Immunologic: Negative.   Neurological: Negative.   Hematological: Negative.   Psychiatric/Behavioral: Negative.     Last hemoglobin A1c Lab Results  Component Value Date   HGBA1C 6.9 (A) 04/17/2018        Objective:    BP 136/80 (BP Location: Left Arm, Patient Position: Sitting, Cuff Size: Large)   Pulse 92   Temp (!) 96.6 F (35.9 C) (Temporal)   Ht 5\' 4"  (1.626 m)   Wt 223 lb (101.2 kg)   SpO2 99%   BMI 38.28 kg/m  BP Readings from Last 3 Encounters:  08/16/19 136/80  04/17/18 126/78  12/12/17 128/76   Wt Readings from Last 3 Encounters:  08/16/19 223 lb (101.2 kg)  04/17/18 215 lb (97.5 kg)  12/12/17 215 lb (97.5 kg)      Physical Exam Constitutional:      Appearance: Normal appearance.  HENT:     Head: Normocephalic and  atraumatic.     Right Ear: External ear normal.     Left Ear: External ear normal.  Eyes:     General: No scleral icterus.    Conjunctiva/sclera: Conjunctivae normal.  Neck:     Vascular: No carotid bruit.  Cardiovascular:     Rate and Rhythm: Normal rate and regular rhythm.     Pulses: Normal pulses.     Heart sounds: Normal heart sounds.  Pulmonary:     Effort: Pulmonary effort is normal.     Breath sounds: Normal breath sounds.  Abdominal:     Palpations: Abdomen is soft.  Musculoskeletal:     Right lower leg: Edema present.     Left lower leg: No edema.  Lymphadenopathy:     Cervical: No cervical adenopathy.  Skin:    General: Skin is warm and dry.  Neurological:     General: No focal deficit present.     Mental Status: She  is alert and oriented to person, place, and time.  Psychiatric:        Mood and Affect: Mood normal.        Behavior: Behavior normal.        Thought Content: Thought content normal.        Judgment: Judgment normal.       No results found for any visits on 08/16/19.    Assessment & Plan:    1. Essential (primary) hypertension Add ACE or ARB if she has any microalbumin in her urine. Positive--plan to add ARB . 2. Pure hypercholesterolemia Check lipids off of Crestor.  Get back on it - TSH - Lipid panel  3. Type 2 diabetes mellitus without complication, without long-term current use of insulin (HCC) Check A1c.  Physical this summer.  Diet and exercise stressed. - TSH - Lipid panel - HgB A1c 4.  Adiposity No follow-ups on file.    CPE- 4 months   Wilhemena Durie, MD  [facility] [phone] (phone) [phone] (fax)  [place]

## 2019-08-16 ENCOUNTER — Encounter: Payer: Self-pay | Admitting: Family Medicine

## 2019-08-16 ENCOUNTER — Ambulatory Visit: Payer: BC Managed Care – PPO | Admitting: Family Medicine

## 2019-08-16 ENCOUNTER — Other Ambulatory Visit: Payer: Self-pay

## 2019-08-16 VITALS — BP 136/80 | HR 92 | Temp 96.6°F | Ht 64.0 in | Wt 223.0 lb

## 2019-08-16 DIAGNOSIS — E119 Type 2 diabetes mellitus without complications: Secondary | ICD-10-CM

## 2019-08-16 DIAGNOSIS — I10 Essential (primary) hypertension: Secondary | ICD-10-CM

## 2019-08-16 DIAGNOSIS — E7800 Pure hypercholesterolemia, unspecified: Secondary | ICD-10-CM

## 2019-08-16 DIAGNOSIS — G4733 Obstructive sleep apnea (adult) (pediatric): Secondary | ICD-10-CM | POA: Diagnosis not present

## 2019-08-16 DIAGNOSIS — Z6838 Body mass index (BMI) 38.0-38.9, adult: Secondary | ICD-10-CM

## 2019-08-16 LAB — POCT UA - MICROALBUMIN: Microalbumin Ur, POC: 50 mg/L

## 2019-08-16 MED ORDER — PIOGLITAZONE HCL 30 MG PO TABS: ORAL_TABLET | ORAL | 4 refills | Status: DC

## 2019-08-16 MED ORDER — TRULICITY 0.75 MG/0.5ML ~~LOC~~ SOAJ: SUBCUTANEOUS | 4 refills | Status: DC

## 2019-08-16 MED ORDER — GLIPIZIDE 10 MG PO TABS: ORAL_TABLET | ORAL | 4 refills | Status: DC

## 2019-08-17 LAB — LIPID PANEL
Chol/HDL Ratio: 4.9 ratio — ABNORMAL HIGH (ref 0.0–4.4)
Cholesterol, Total: 220 mg/dL — ABNORMAL HIGH (ref 100–199)
HDL: 45 mg/dL (ref >39)
LDL Chol Calc (NIH): 122 mg/dL — ABNORMAL HIGH (ref 0–99)
Triglycerides: 299 mg/dL — ABNORMAL HIGH (ref 0–149)
VLDL Cholesterol Cal: 53 mg/dL — ABNORMAL HIGH (ref 5–40)

## 2019-08-17 LAB — HEMOGLOBIN A1C
Est. average glucose Bld gHb Est-mCnc: 292 mg/dL
Hgb A1c MFr Bld: 11.8 % — ABNORMAL HIGH (ref 4.8–5.6)

## 2019-08-17 LAB — TSH: TSH: 2.51 u[IU]/mL (ref 0.450–4.500)

## 2019-08-30 ENCOUNTER — Other Ambulatory Visit: Payer: Self-pay | Admitting: Family Medicine

## 2019-09-13 ENCOUNTER — Other Ambulatory Visit: Payer: Self-pay | Admitting: Family Medicine

## 2019-09-13 DIAGNOSIS — E119 Type 2 diabetes mellitus without complications: Secondary | ICD-10-CM

## 2019-10-11 ENCOUNTER — Other Ambulatory Visit: Payer: Self-pay | Admitting: Family Medicine

## 2019-10-11 DIAGNOSIS — E119 Type 2 diabetes mellitus without complications: Secondary | ICD-10-CM

## 2019-12-06 ENCOUNTER — Other Ambulatory Visit: Payer: Self-pay | Admitting: Family Medicine

## 2019-12-06 DIAGNOSIS — E119 Type 2 diabetes mellitus without complications: Secondary | ICD-10-CM

## 2019-12-17 NOTE — Progress Notes (Addendum)
Trena Platt Cummings,acting as a scribe for Wilhemena Durie, MD.,have documented all relevant documentation on the behalf of Wilhemena Durie, MD,as directed by  Wilhemena Durie, MD while in the presence of Wilhemena Durie, MD.  Complete physical exam   Patient: Kim Ashley   DOB: 29-May-1960   59 y.o. Female  MRN: [id number] Visit Date: 12/18/2019  Today's healthcare provider: Wilhemena Durie, MD   Chief Complaint  Patient presents with  . Annual Exam   Subjective    Kim Ashley is a 59 y.o. female who presents today for a complete physical exam.  She reports consuming a general diet. Home exercise routine includes walking. She generally feels fairly well. She reports sleeping fairly well. She does not have additional problems to discuss today.    HPI   Needs refill on metformin and glipizide.  Patient said that she would like to discuss mole on rt shoulder that changes shape.  It is irritated on her right mid anterior shoulder and has a skin tag catches on her bra strap her necklace and her close.  Past Medical History:  Diagnosis Date  . Diabetes mellitus without complication ([place])   . Environmental allergies   . Hypertension   . Wears contact lenses    Past Surgical History:  Procedure Laterality Date  . 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 APPENDECTOMY    . CHOLECYSTECTOMY    . COLONOSCOPY    . COLONOSCOPY WITH PROPOFOL N/A 04/21/2015   Procedure: COLONOSCOPY WITH PROPOFOL;  Surgeon: Lucilla Lame, MD;  Location: [facility];  Service: Endoscopy;  Laterality: N/A;  Diabetic - oral meds  . POLYPECTOMY  04/21/2015   Procedure: POLYPECTOMY INTESTINAL;  Surgeon: Lucilla Lame, MD;  Location: [facility];  Service: Endoscopy;;  . TONSILLECTOMY    . TUBAL LIGATION     Social History   Socioeconomic History  . Marital status: Married    Spouse name: Yvone Neu  . Number of children: 3  . Years of  education: 49  . Highest education level: Not on file  Occupational History  . 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 Smoking status: Never Smoker  . Smokeless tobacco: Never Used  Substance and Sexual Activity  . Alcohol use: No  . Drug use: No  . Sexual activity: Yes  Other Topics Concern  . Not on file  Social History Narrative  . Not on file   Social Determinants of Health   Financial Resource Strain:   . Difficulty of Paying Living Expenses:   Food Insecurity:   . Worried About Charity fundraiser in the Last Year:   . Arboriculturist in the Last Year:   Transportation Needs:   . Film/video editor (Medical):   Marland Kitchen Lack of Transportation (Non-Medical):   Physical Activity:   . Days of Exercise per Week:   . Minutes of Exercise per Session:   Stress:   . Feeling of Stress :   Social Connections:   . Frequency of Communication with Friends and Family:   . Frequency of Social Gatherings with Friends and Family:   . Attends Religious Services:   . Active Member of Clubs or Organizations:   . Attends Archivist Meetings:   Marland Kitchen Marital Status:   Intimate Partner Violence:   . Fear of Current or Ex-Partner:   . Emotionally Abused:   .  Physically Abused:   . Sexually Abused:    Family Status  Relation Name Status  . Mother  Deceased  . Father  Deceased  . Brother  Alive  . Sister  Alive  . Brother  Deceased  . Son  Alive  . Son  Alive  . Son  Alive   Family History  Problem Relation Age of Onset  . Hypertension Mother   . Heart attack Mother   . Arrhythmia Mother   . Colon cancer Father   . Diabetes Father   . Stroke Father   . Diabetes Brother   . Hypertension Brother   . Stomach cancer Brother    Allergies  Allergen Reactions  . Byetta 10 Mcg Pen [Exenatide] Nausea And Vomiting  . Lisinopril Cough  . Niacin Other (See Comments)    Joint aches, flushing  . Simvastatin Other (See Comments)    Joint aches, flushing     Patient Care Team: Jerrol Banana., MD as PCP - General (Family Medicine)   Medications: Outpatient Medications Prior to Visit  Medication Sig  . Dulaglutide (TRULICITY) 6.30 ZS/0.1UX SOPN INJECT THE CONTENTS OF 1 PEN( 0.75 MG) UNDER THE SKIN ONCE A WEEK  . glipiZIDE (GLUCOTROL) 10 MG tablet TAKE 1 TABLET(10 MG) BY MOUTH TWICE DAILY  . glucose blood (BAYER CONTOUR NEXT TEST) test strip CONTOUR NEXT EZ. Dx E11.9 Test blood sugar fasting once daily  . Lancets MISC 1 each by Does not apply route daily. Dx E11.9 test once daily  . levothyroxine (SYNTHROID) 25 MCG tablet TAKE 1 TABLET(25 MCG) BY MOUTH DAILY BEFORE BREAKFAST  . metFORMIN (GLUCOPHAGE) 1000 MG tablet TAKE 1 TABLET(1000 MG) BY MOUTH TWICE DAILY WITH A MEAL  . pioglitazone (ACTOS) 30 MG tablet TAKE 1 TABLET(30 MG) BY MOUTH DAILY  . rosuvastatin (CRESTOR) 10 MG tablet TAKE 1 TABLET(10 MG) BY MOUTH DAILY  . loratadine (CLARITIN) 10 MG tablet Take 10 mg by mouth daily. AM (Patient not taking: Reported on 12/18/2019)   No facility-administered medications prior to visit.    Review of Systems  Constitutional: Negative.   HENT: Negative.   Eyes: Negative.   Respiratory: Negative.   Cardiovascular: Negative.   Gastrointestinal: Negative.   Endocrine: Negative.   Genitourinary: Negative.   Musculoskeletal: Negative.   Skin: Negative.   Allergic/Immunologic: Negative.   Neurological: Positive for dizziness and light-headedness.  Hematological: Negative.   Psychiatric/Behavioral: Negative.     Last hemoglobin A1c Lab Results  Component Value Date   HGBA1C 11.8 (H) 08/16/2019      Objective    BP 125/82 (BP Location: Right Arm, Patient Position: Sitting, Cuff Size: Large)   Pulse 88   Temp 98.1 F (36.7 C) (Oral)   Ht 5\' 4"  (1.626 m)   Wt 225 lb (102.1 kg)   BMI 38.62 kg/m  BP Readings from Last 3 Encounters:  12/18/19 125/82  08/16/19 136/80  04/17/18 126/78   Wt Readings from Last 3 Encounters:    12/18/19 225 lb (102.1 kg)  08/16/19 223 lb (101.2 kg)  04/17/18 215 lb (97.5 kg)      Physical Exam Vitals reviewed.  Constitutional:      Appearance: She is well-developed.  HENT:     Head: Normocephalic.  Eyes:     Conjunctiva/sclera: Conjunctivae normal.     Pupils: Pupils are equal, round, and reactive to light.  Cardiovascular:     Rate and Rhythm: Normal rate and regular rhythm.     Heart sounds:  Normal heart sounds. No murmur heard.   Pulmonary:     Effort: Pulmonary effort is normal. No respiratory distress.     Breath sounds: Normal breath sounds. No wheezing.  Abdominal:     General: Bowel sounds are normal. There is no distension.     Palpations: Abdomen is soft.     Tenderness: There is no abdominal tenderness.     Comments: Specifically no mass effect or tenderness.  Musculoskeletal:        General: No tenderness. Normal range of motion.     Cervical back: Normal range of motion and neck supple.  Skin:    General: Skin is warm and dry.     Findings: No rash.     Comments: She is a large irritated skin tag on her right anterior shoulder in the supraclavicular area.  Neurological:     Mental Status: She is alert and oriented to person, place, and time.  Psychiatric:        Behavior: Behavior normal.        Thought Content: Thought content normal.       Last depression screening scores PHQ 2/9 Scores 08/16/2019 12/23/2016 12/23/2014  PHQ - 2 Score 0 0 0  PHQ- 9 Score 3 1 -   Last fall risk screening Fall Risk  08/16/2019  Falls in the past year? 0  Number falls in past yr: 0  Injury with Fall? 0  Follow up Falls evaluation completed   Last Audit-C alcohol use screening Alcohol Use Disorder Test (AUDIT) 08/16/2019  1. How often do you have a drink containing alcohol? 0  2. How many drinks containing alcohol do you have on a typical day when you are drinking? 0  3. How often do you have six or more drinks on one occasion? 0  AUDIT-C Score 0   A  score of 3 or more in women, and 4 or more in men indicates increased risk for alcohol abuse, EXCEPT if all of the points are from question 1   No results found for any visits on 12/18/19.  Assessment & Plan    Routine Health Maintenance and Physical Exam  Exercise Activities and Dietary recommendations Goals   None     Immunization History  Administered Date(s) Administered  . Influenza,inj,Quad PF,6+ Mos 04/24/2015, 01/19/2017, 04/17/2018  . Influenza-Unspecified 04/12/2016  . Pneumococcal Polysaccharide-23 07/13/2010, 05/23/2012  . Tdap 07/13/2010  . Zoster 07/13/2010    Health Maintenance  Topic Date Due  . Hepatitis C Screening  Never done  . COVID-19 Vaccine (1) Never done  . HIV Screening  Never done  . MAMMOGRAM  12/01/2016  . PAP SMEAR-Modifier  12/08/2017  . OPHTHALMOLOGY EXAM  01/04/2019  . INFLUENZA VACCINE  12/02/2019  . HEMOGLOBIN A1C  02/15/2020  . COLONOSCOPY  04/20/2020  . TETANUS/TDAP  07/12/2020  . FOOT EXAM  08/15/2020  . URINE MICROALBUMIN  08/15/2020  . PNEUMOCOCCAL POLYSACCHARIDE VACCINE AGE 28-64 HIGH RISK  Completed    Discussed health benefits of physical activity, and encouraged her to engage in regular exercise appropriate for her age and condition. 1. Annual physical exam Well woman exam per GYN.  2. Essential (primary) hypertension Start losartan 25 mg daily due to microalbumin in the urine - losartan (COZAAR) 25 MG tablet; Take 1 tablet (25 mg total) by mouth daily.  Dispense: 90 tablet; Refill: 1 - CBC with Differential/Platelet - TSH - Lipid panel - Comprehensive metabolic panel - HgB M3T  3. Pure hypercholesterolemia  -  CBC with Differential/Platelet - TSH - Lipid panel - Comprehensive metabolic panel - HgB U8H  4. Type 2 diabetes mellitus without complication, without long-term current use of insulin (HCC) Goal A1c less than 7 - CBC with Differential/Platelet - TSH - Lipid panel - Comprehensive metabolic panel - HgB  F2B - glipiZIDE (GLUCOTROL) 10 MG tablet; TAKE 1 TABLET(10 MG) BY MOUTH TWICE DAILY  Dispense: 60 tablet; Refill: 4 - metFORMIN (GLUCOPHAGE) 1000 MG tablet; TAKE 1 TABLET(1000 MG) BY MOUTH TWICE DAILY WITH A MEAL  Dispense: 180 tablet; Refill: 2  5. Class 2 severe obesity due to excess calories with serious comorbidity and body mass index (BMI) of 38.0 to 38.9 in adult [facility]) With diabetes hypertension hyperlipidemia - CBC with Differential/Platelet - TSH - Lipid panel - Comprehensive metabolic panel - HgB M2X  6. Inflamed Skin tag Irritated Stated skin tag is cleaned with Betadine and anesthesia with lidocaine with epi.  It is clipped at the base and hemostasis is provided with direct pressure    No follow-ups on file.        Raneshia Derick Cranford Mon, MD  [facility] [phone] (phone) [phone] (fax)  [place]

## 2019-12-18 ENCOUNTER — Encounter: Payer: Self-pay | Admitting: Family Medicine

## 2019-12-18 ENCOUNTER — Other Ambulatory Visit: Payer: Self-pay

## 2019-12-18 ENCOUNTER — Ambulatory Visit (INDEPENDENT_AMBULATORY_CARE_PROVIDER_SITE_OTHER): Payer: BC Managed Care – PPO | Admitting: Family Medicine

## 2019-12-18 VITALS — BP 125/82 | HR 88 | Temp 98.1°F | Ht 64.0 in | Wt 225.0 lb

## 2019-12-18 DIAGNOSIS — I10 Essential (primary) hypertension: Secondary | ICD-10-CM

## 2019-12-18 DIAGNOSIS — E119 Type 2 diabetes mellitus without complications: Secondary | ICD-10-CM

## 2019-12-18 DIAGNOSIS — E7800 Pure hypercholesterolemia, unspecified: Secondary | ICD-10-CM

## 2019-12-18 DIAGNOSIS — Z0000 Encounter for general adult medical examination without abnormal findings: Secondary | ICD-10-CM

## 2019-12-18 DIAGNOSIS — L918 Other hypertrophic disorders of the skin: Secondary | ICD-10-CM

## 2019-12-18 DIAGNOSIS — Z6838 Body mass index (BMI) 38.0-38.9, adult: Secondary | ICD-10-CM

## 2019-12-18 MED ORDER — METFORMIN HCL 1000 MG PO TABS: ORAL_TABLET | ORAL | 2 refills | Status: DC

## 2019-12-18 MED ORDER — GLIPIZIDE 10 MG PO TABS: ORAL_TABLET | ORAL | 4 refills | Status: DC

## 2019-12-18 MED ORDER — LOSARTAN POTASSIUM 25 MG PO TABS: 25.0000 mg | ORAL_TABLET | Freq: Every day | ORAL | 1 refills | Status: DC

## 2019-12-19 LAB — LIPID PANEL
Chol/HDL Ratio: 2 ratio (ref 0.0–4.4)
Cholesterol, Total: 122 mg/dL (ref 100–199)
HDL: 60 mg/dL (ref >39)
LDL Chol Calc (NIH): 45 mg/dL (ref 0–99)
Triglycerides: 87 mg/dL (ref 0–149)
VLDL Cholesterol Cal: 17 mg/dL (ref 5–40)

## 2019-12-19 LAB — CBC WITH DIFFERENTIAL/PLATELET
Basophils Absolute: 0 10*3/uL (ref 0.0–0.2)
Basos: 1 %
EOS (ABSOLUTE): 0.1 10*3/uL (ref 0.0–0.4)
Eos: 1 %
Hematocrit: 42.7 % (ref 34.0–46.6)
Hemoglobin: 13.9 g/dL (ref 11.1–15.9)
Immature Grans (Abs): 0 10*3/uL (ref 0.0–0.1)
Immature Granulocytes: 0 %
Lymphocytes Absolute: 1.7 10*3/uL (ref 0.7–3.1)
Lymphs: 28 %
MCH: 28.4 pg (ref 26.6–33.0)
MCHC: 32.6 g/dL (ref 31.5–35.7)
MCV: 87 fL (ref 79–97)
Monocytes Absolute: 0.4 10*3/uL (ref 0.1–0.9)
Monocytes: 7 %
Neutrophils Absolute: 4 10*3/uL (ref 1.4–7.0)
Neutrophils: 63 %
Platelets: 175 10*3/uL (ref 150–450)
RBC: 4.89 x10E6/uL (ref 3.77–5.28)
RDW: 13.4 % (ref 11.7–15.4)
WBC: 6.2 10*3/uL (ref 3.4–10.8)

## 2019-12-19 LAB — COMPREHENSIVE METABOLIC PANEL
ALT: 20 IU/L (ref 0–32)
AST: 16 IU/L (ref 0–40)
Albumin/Globulin Ratio: 1.8 (ref 1.2–2.2)
Albumin: 4.4 g/dL (ref 3.8–4.9)
Alkaline Phosphatase: 47 IU/L — ABNORMAL LOW (ref 48–121)
BUN/Creatinine Ratio: 14 (ref 9–23)
BUN: 11 mg/dL (ref 6–24)
Bilirubin Total: 0.4 mg/dL (ref 0.0–1.2)
CO2: 24 mmol/L (ref 20–29)
Calcium: 9.5 mg/dL (ref 8.7–10.2)
Chloride: 102 mmol/L (ref 96–106)
Creatinine, Ser: 0.76 mg/dL (ref 0.57–1.00)
GFR calc Af Amer: 101 mL/min/{1.73_m2} (ref >59)
GFR calc non Af Amer: 87 mL/min/{1.73_m2} (ref >59)
Globulin, Total: 2.5 g/dL (ref 1.5–4.5)
Glucose: 143 mg/dL — ABNORMAL HIGH (ref 65–99)
Potassium: 4.2 mmol/L (ref 3.5–5.2)
Sodium: 140 mmol/L (ref 134–144)
Total Protein: 6.9 g/dL (ref 6.0–8.5)

## 2019-12-19 LAB — TSH: TSH: 3.54 u[IU]/mL (ref 0.450–4.500)

## 2019-12-19 LAB — HEMOGLOBIN A1C
Est. average glucose Bld gHb Est-mCnc: 180 mg/dL
Hgb A1c MFr Bld: 7.9 % — ABNORMAL HIGH (ref 4.8–5.6)

## 2019-12-20 ENCOUNTER — Telehealth: Payer: Self-pay

## 2019-12-20 NOTE — Telephone Encounter (Signed)
-----   Message from Jerrol Banana., MD sent at 12/20/2019 11:39 AM EDT ----- Diabetes and cholesterol both improved.

## 2019-12-20 NOTE — Telephone Encounter (Signed)
Patient advised of lab results via mychart and has read the provider's comments.

## 2020-01-04 ENCOUNTER — Other Ambulatory Visit: Payer: Self-pay | Admitting: Family Medicine

## 2020-01-04 DIAGNOSIS — E119 Type 2 diabetes mellitus without complications: Secondary | ICD-10-CM

## 2020-01-13 ENCOUNTER — Encounter: Payer: Self-pay | Admitting: Family Medicine

## 2020-01-14 ENCOUNTER — Other Ambulatory Visit: Payer: Self-pay | Admitting: *Deleted

## 2020-01-14 DIAGNOSIS — E119 Type 2 diabetes mellitus without complications: Secondary | ICD-10-CM

## 2020-01-14 MED ORDER — TRULICITY 0.75 MG/0.5ML ~~LOC~~ SOAJ: SUBCUTANEOUS | 4 refills | Status: DC

## 2020-01-16 ENCOUNTER — Encounter: Payer: Self-pay | Admitting: Family Medicine

## 2020-01-22 NOTE — Telephone Encounter (Signed)
It was an irritated skin tag but caught on clothing.  I put that in the note but evidently insurance did not care.  Can that be amended anyway?  If not then I do not know what else to do.  If not just leave it as it is.  Thank you

## 2020-01-23 NOTE — Telephone Encounter (Signed)
You will need to do an addendum to her chart and add L91.8 dx code so I can have it refiled.   cbe

## 2020-03-04 ENCOUNTER — Other Ambulatory Visit: Payer: Self-pay | Admitting: Family Medicine

## 2020-03-04 DIAGNOSIS — E119 Type 2 diabetes mellitus without complications: Secondary | ICD-10-CM

## 2020-04-18 NOTE — Progress Notes (Deleted)
MyChart Video Visit    Virtual Visit via Video Note   This visit type was conducted due to national recommendations for restrictions regarding the COVID-19 Pandemic (e.g. social distancing) in an effort to limit this patient's exposure and mitigate transmission in our community. This patient is at least at moderate risk for complications without adequate follow up. This format is felt to be most appropriate for this patient at this time. Physical exam was limited by quality of the video and audio technology used for the visit.   Patient location: *** Provider location: ***  I discussed the limitations of evaluation and management by telemedicine and the availability of in person appointments. The patient expressed understanding and agreed to proceed.  Patient: Kim Ashley   DOB: 1960/09/24   59 y.o. Female  MRN: [id number] Visit Date: 04/21/2020  Today's healthcare provider: Wilhemena Durie, MD   No chief complaint on file.  Subjective    HPI  Hypertension, follow-up  BP Readings from Last 3 Encounters:  12/18/19 125/82  08/16/19 136/80  04/17/18 126/78   Wt Readings from Last 3 Encounters:  12/18/19 225 lb (102.1 kg)  08/16/19 223 lb (101.2 kg)  04/17/18 215 lb (97.5 kg)     She was last seen for hypertension 4 months ago.  BP at that visit was 125/82. Management since that visit includes starting Losartan 25mg  daily.  She reports {excellent/good/fair/poor:19665} compliance with treatment. She {is/is not:9024} having side effects. {document side effects if present:1} She is following a {diet:21022986} diet. She {is/is not:9024} exercising. She {does/does not:200015} smoke.  Use of agents associated with hypertension: {bp agents assoc with hypertension:511::"none"}.   Outside blood pressures are {***enter patient reported home BP readings, or 'not being checked':1}. Symptoms: {Yes/No:20286} chest pain {Yes/No:20286} chest pressure  {Yes/No:20286} palpitations  {Yes/No:20286} syncope  {Yes/No:20286} dyspnea {Yes/No:20286} orthopnea  {Yes/No:20286} paroxysmal nocturnal dyspnea {Yes/No:20286} lower extremity edema   Pertinent labs: Lab Results  Component Value Date   CHOL 122 12/18/2019   HDL 60 12/18/2019   LDLCALC 45 12/18/2019   TRIG 87 12/18/2019   CHOLHDL 2.0 12/18/2019   Lab Results  Component Value Date   NA 140 12/18/2019   K 4.2 12/18/2019   CREATININE 0.76 12/18/2019   GFRNONAA 87 12/18/2019   GFRAA 101 12/18/2019   GLUCOSE 143 (H) 12/18/2019     The ASCVD Risk score (Goff DC Jr., et al., 2013) failed to calculate for the following reasons:   The valid total cholesterol range is 130 to 320 mg/dL   Diabetes Mellitus Type II, Follow-up  Lab Results  Component Value Date   HGBA1C 7.9 (H) 12/18/2019   HGBA1C 11.8 (H) 08/16/2019   HGBA1C 6.9 (A) 04/17/2018   Wt Readings from Last 3 Encounters:  12/18/19 225 lb (102.1 kg)  08/16/19 223 lb (101.2 kg)  04/17/18 215 lb (97.5 kg)   Last seen for diabetes 4 months ago.  Management since then includes no changes. She reports {excellent/good/fair/poor:19665} compliance with treatment. She {is/is not:21021397} having side effects. {document side effects if present:1} Symptoms: {Yes/No:20286} fatigue {Yes/No:20286} foot ulcerations  {Yes/No:20286} appetite changes {Yes/No:20286} nausea  {Yes/No:20286} paresthesia of the feet  {Yes/No:20286} polydipsia  {Yes/No:20286} polyuria {Yes/No:20286} visual disturbances   {Yes/No:20286} vomiting     Home blood sugar records: {diabetes glucometry results:16657}  Episodes of hypoglycemia? {Yes/No:20286} {enter symptoms and frequency of symptoms if yes:1}   Current insulin regiment: {enter 'none' or type of insulin and number of units taken with each dose of  each insulin formulation that the patient is taking:1} Most Recent Eye Exam: *** {Current exercise:16438:::1} {Current diet habits:16563:::1}  Pertinent Labs: Lab Results   Component Value Date   CHOL 122 12/18/2019   HDL 60 12/18/2019   LDLCALC 45 12/18/2019   TRIG 87 12/18/2019   CHOLHDL 2.0 12/18/2019   Lab Results  Component Value Date   NA 140 12/18/2019   K 4.2 12/18/2019   CREATININE 0.76 12/18/2019   GFRNONAA 87 12/18/2019   GFRAA 101 12/18/2019   GLUCOSE 143 (H) 12/18/2019       {Show patient history (optional):23778::" "}  Medications: Outpatient Medications Prior to Visit  Medication Sig  . Dulaglutide (TRULICITY) 1.27 NT/7.0YF SOPN INJECT THE CONTENTS OF 1 PEN( 0.75 MG) UNDER THE SKIN ONCE A WEEK  . glipiZIDE (GLUCOTROL) 10 MG tablet TAKE 1 TABLET(10 MG) BY MOUTH TWICE DAILY  . glucose blood (BAYER CONTOUR NEXT TEST) test strip CONTOUR NEXT EZ. Dx E11.9 Test blood sugar fasting once daily  . Lancets MISC 1 each by Does not apply route daily. Dx E11.9 test once daily  . levothyroxine (SYNTHROID) 25 MCG tablet TAKE 1 TABLET(25 MCG) BY MOUTH DAILY BEFORE BREAKFAST  . loratadine (CLARITIN) 10 MG tablet Take 10 mg by mouth daily. AM (Patient not taking: Reported on 12/18/2019)  . losartan (COZAAR) 25 MG tablet Take 1 tablet (25 mg total) by mouth daily.  . metFORMIN (GLUCOPHAGE) 1000 MG tablet TAKE 1 TABLET(1000 MG) BY MOUTH TWICE DAILY WITH A MEAL  . pioglitazone (ACTOS) 30 MG tablet TAKE 1 TABLET(30 MG) BY MOUTH DAILY  . rosuvastatin (CRESTOR) 10 MG tablet TAKE 1 TABLET(10 MG) BY MOUTH DAILY   No facility-administered medications prior to visit.    Review of Systems  {Heme  Chem  Endocrine  Serology  Results Review (optional):23779::" "}  Objective    There were no vitals taken for this visit. {Show previous vital signs (optional):23777::" "}  Physical Exam     Assessment & Plan     ***  No follow-ups on file.     I discussed the assessment and treatment plan with the patient. The patient was provided an opportunity to ask questions and all were answered. The patient agreed with the plan and demonstrated an  understanding of the instructions.   The patient was advised to call back or seek an in-person evaluation if the symptoms worsen or if the condition fails to improve as anticipated.  I provided *** minutes of non-face-to-face time during this encounter.  {provider attestation***:1}  Wilhemena Durie, MD [facility] [phone] (phone) [phone] (fax)  [place]

## 2020-04-21 ENCOUNTER — Ambulatory Visit: Payer: Self-pay | Admitting: Family Medicine

## 2020-04-21 ENCOUNTER — Telehealth: Payer: BC Managed Care – PPO | Admitting: Family Medicine

## 2020-05-06 ENCOUNTER — Ambulatory Visit: Payer: BC Managed Care – PPO | Admitting: Family Medicine

## 2020-05-06 ENCOUNTER — Other Ambulatory Visit: Payer: Self-pay

## 2020-05-06 ENCOUNTER — Encounter: Payer: Self-pay | Admitting: Family Medicine

## 2020-05-06 VITALS — BP 126/80 | HR 84 | Temp 98.5°F | Resp 16 | Ht 64.0 in | Wt 240.0 lb

## 2020-05-06 DIAGNOSIS — E119 Type 2 diabetes mellitus without complications: Secondary | ICD-10-CM | POA: Diagnosis not present

## 2020-05-06 DIAGNOSIS — Z23 Encounter for immunization: Secondary | ICD-10-CM | POA: Diagnosis not present

## 2020-05-06 DIAGNOSIS — E7800 Pure hypercholesterolemia, unspecified: Secondary | ICD-10-CM

## 2020-05-06 DIAGNOSIS — I10 Essential (primary) hypertension: Secondary | ICD-10-CM | POA: Diagnosis not present

## 2020-05-06 DIAGNOSIS — E66812 Obesity, class 2: Secondary | ICD-10-CM

## 2020-05-06 DIAGNOSIS — Z6838 Body mass index (BMI) 38.0-38.9, adult: Secondary | ICD-10-CM

## 2020-05-06 LAB — POCT GLYCOSYLATED HEMOGLOBIN (HGB A1C)
Est. average glucose Bld gHb Est-mCnc: 194
Hemoglobin A1C: 8.4 % — AB (ref 4.0–5.6)

## 2020-05-06 MED ORDER — TRULICITY 0.75 MG/0.5ML ~~LOC~~ SOAJ: 1.5000 mg | SUBCUTANEOUS | 4 refills | Status: DC

## 2020-05-06 NOTE — Patient Instructions (Signed)
Hypertension, Adult High blood pressure (hypertension) is when the force of blood pumping through the arteries is too strong. The arteries are the blood vessels that carry blood from the heart throughout the body. Hypertension forces the heart to work harder to pump blood and may cause arteries to become narrow or stiff. Untreated or uncontrolled hypertension can cause a heart attack, heart failure, a stroke, kidney disease, and other problems. A blood pressure reading consists of a higher number over a lower number. Ideally, your blood pressure should be below 120/80. The first ("top") number is called the systolic pressure. It is a measure of the pressure in your arteries as your heart beats. The second ("bottom") number is called the diastolic pressure. It is a measure of the pressure in your arteries as the heart relaxes. What are the causes? The exact cause of this condition is not known. There are some conditions that result in or are related to high blood pressure. What increases the risk? Some risk factors for high blood pressure are under your control. The following factors may make you more likely to develop this condition:  Smoking.  Having type 2 diabetes mellitus, high cholesterol, or both.  Not getting enough exercise or physical activity.  Being overweight.  Having too much fat, sugar, calories, or salt (sodium) in your diet.  Drinking too much alcohol. Some risk factors for high blood pressure may be difficult or impossible to change. Some of these factors include:  Having chronic kidney disease.  Having a family history of high blood pressure.  Age. Risk increases with age.  Race. You may be at higher risk if you are African American.  Gender. Men are at higher risk than women before age 45. After age 65, women are at higher risk than men.  Having obstructive sleep apnea.  Stress. What are the signs or symptoms? High blood pressure may not cause symptoms. Very high  blood pressure (hypertensive crisis) may cause:  Headache.  Anxiety.  Shortness of breath.  Nosebleed.  Nausea and vomiting.  Vision changes.  Severe chest pain.  Seizures. How is this diagnosed? This condition is diagnosed by measuring your blood pressure while you are seated, with your arm resting on a flat surface, your legs uncrossed, and your feet flat on the floor. The cuff of the blood pressure monitor will be placed directly against the skin of your upper arm at the level of your heart. It should be measured at least twice using the same arm. Certain conditions can cause a difference in blood pressure between your right and left arms. Certain factors can cause blood pressure readings to be lower or higher than normal for a short period of time:  When your blood pressure is higher when you are in a health care provider's office than when you are at home, this is called white coat hypertension. Most people with this condition do not need medicines.  When your blood pressure is higher at home than when you are in a health care provider's office, this is called masked hypertension. Most people with this condition may need medicines to control blood pressure. If you have a high blood pressure reading during one visit or you have normal blood pressure with other risk factors, you may be asked to:  Return on a different day to have your blood pressure checked again.  Monitor your blood pressure at home for 1 week or longer. If you are diagnosed with hypertension, you may have other blood or   imaging tests to help your health care provider understand your overall risk for other conditions. How is this treated? This condition is treated by making healthy lifestyle changes, such as eating healthy foods, exercising more, and reducing your alcohol intake. Your health care provider may prescribe medicine if lifestyle changes are not enough to get your blood pressure under control, and  if:  Your systolic blood pressure is above 130.  Your diastolic blood pressure is above 80. Your personal target blood pressure may vary depending on your medical conditions, your age, and other factors. Follow these instructions at home: Eating and drinking   Eat a diet that is high in fiber and potassium, and low in sodium, added sugar, and fat. An example eating plan is called the DASH (Dietary Approaches to Stop Hypertension) diet. To eat this way: ? Eat plenty of fresh fruits and vegetables. Try to fill one half of your plate at each meal with fruits and vegetables. ? Eat whole grains, such as whole-wheat pasta, brown rice, or whole-grain bread. Fill about one fourth of your plate with whole grains. ? Eat or drink low-fat dairy products, such as skim milk or low-fat yogurt. ? Avoid fatty cuts of meat, processed or cured meats, and poultry with skin. Fill about one fourth of your plate with lean proteins, such as fish, chicken without skin, beans, eggs, or tofu. ? Avoid pre-made and processed foods. These tend to be higher in sodium, added sugar, and fat.  Reduce your daily sodium intake. Most people with hypertension should eat less than 1,500 mg of sodium a day.  Do not drink alcohol if: ? Your health care provider tells you not to drink. ? You are pregnant, may be pregnant, or are planning to become pregnant.  If you drink alcohol: ? Limit how much you use to:  0-1 drink a day for women.  0-2 drinks a day for men. ? Be aware of how much alcohol is in your drink. In the [place] one drink equals one 12 oz bottle of beer (355 mL), one 5 oz glass of wine (148 mL), or one 1 oz glass of hard liquor (44 mL). Lifestyle   Work with your health care provider to maintain a healthy body weight or to lose weight. Ask what an ideal weight is for you.  Get at least 30 minutes of exercise most days of the week. Activities may include walking, swimming, or biking.  Include exercise to  strengthen your muscles (resistance exercise), such as Pilates or lifting weights, as part of your weekly exercise routine. Try to do these types of exercises for 30 minutes at least 3 days a week.  Do not use any products that contain nicotine or tobacco, such as cigarettes, e-cigarettes, and chewing tobacco. If you need help quitting, ask your health care provider.  Monitor your blood pressure at home as told by your health care provider.  Keep all follow-up visits as told by your health care provider. This is important. Medicines  Take over-the-counter and prescription medicines only as told by your health care provider. Follow directions carefully. Blood pressure medicines must be taken as prescribed.  Do not skip doses of blood pressure medicine. Doing this puts you at risk for problems and can make the medicine less effective.  Ask your health care provider about side effects or reactions to medicines that you should watch for. Contact a health care provider if you:  Think you are having a reaction to a medicine you   are taking.  Have headaches that keep coming back (recurring).  Feel dizzy.  Have swelling in your ankles.  Have trouble with your vision. Get help right away if you:  Develop a severe headache or confusion.  Have unusual weakness or numbness.  Feel faint.  Have severe pain in your chest or abdomen.  Vomit repeatedly.  Have trouble breathing. Summary  Hypertension is when the force of blood pumping through your arteries is too strong. If this condition is not controlled, it may put you at risk for serious complications.  Your personal target blood pressure may vary depending on your medical conditions, your age, and other factors. For most people, a normal blood pressure is less than 120/80.  Hypertension is treated with lifestyle changes, medicines, or a combination of both. Lifestyle changes include losing weight, eating a healthy, low-sodium diet,  exercising more, and limiting alcohol. This information is not intended to replace advice given to you by your health care provider. Make sure you discuss any questions you have with your health care provider. Document Revised: 12/28/2017 Document Reviewed: 12/28/2017 Elsevier Patient Education  2020 [employer]. Diabetes Mellitus and Nutrition, Adult When you have diabetes (diabetes mellitus), it is very important to have healthy eating habits because your blood sugar (glucose) levels are greatly affected by what you eat and drink. Eating healthy foods in the appropriate amounts, at about the same times every day, can help you:  Control your blood glucose.  Lower your risk of heart disease.  Improve your blood pressure.  Reach or maintain a healthy weight. Every person with diabetes is different, and each person has different needs for a meal plan. Your health care provider may recommend that you work with a diet and nutrition specialist (dietitian) to make a meal plan that is best for you. Your meal plan may vary depending on factors such as:  The calories you need.  The medicines you take.  Your weight.  Your blood glucose, blood pressure, and cholesterol levels.  Your activity level.  Other health conditions you have, such as heart or kidney disease. How do carbohydrates affect me? Carbohydrates, also called carbs, affect your blood glucose level more than any other type of food. Eating carbs naturally raises the amount of glucose in your blood. Carb counting is a method for keeping track of how many carbs you eat. Counting carbs is important to keep your blood glucose at a healthy level, especially if you use insulin or take certain oral diabetes medicines. It is important to know how many carbs you can safely have in each meal. This is different for every person. Your dietitian can help you calculate how many carbs you should have at each meal and for each snack. Foods that  contain carbs include:  Bread, cereal, rice, pasta, and crackers.  Potatoes and corn.  Peas, beans, and lentils.  Milk and yogurt.  Fruit and juice.  Desserts, such as cakes, cookies, ice cream, and candy. How does alcohol affect me? Alcohol can cause a sudden decrease in blood glucose (hypoglycemia), especially if you use insulin or take certain oral diabetes medicines. Hypoglycemia can be a life-threatening condition. Symptoms of hypoglycemia (sleepiness, dizziness, and confusion) are similar to symptoms of having too much alcohol. If your health care provider says that alcohol is safe for you, follow these guidelines:  Limit alcohol intake to no more than 1 drink per day for nonpregnant women and 2 drinks per day for men. One drink equals 12 oz  of beer, 5 oz of wine, or 1 oz of hard liquor.  Do not drink on an empty stomach.  Keep yourself hydrated with water, diet soda, or unsweetened iced tea.  Keep in mind that regular soda, juice, and other mixers may contain a lot of sugar and must be counted as carbs. What are tips for following this plan?  Reading food labels  Start by checking the serving size on the "Nutrition Facts" label of packaged foods and drinks. The amount of calories, carbs, fats, and other nutrients listed on the label is based on one serving of the item. Many items contain more than one serving per package.  Check the total grams (g) of carbs in one serving. You can calculate the number of servings of carbs in one serving by dividing the total carbs by 15. For example, if a food has 30 g of total carbs, it would be equal to 2 servings of carbs.  Check the number of grams (g) of saturated and trans fats in one serving. Choose foods that have low or no amount of these fats.  Check the number of milligrams (mg) of salt (sodium) in one serving. Most people should limit total sodium intake to less than 2,300 mg per day.  Always check the nutrition information of  foods labeled as "low-fat" or "nonfat". These foods may be higher in added sugar or refined carbs and should be avoided.  Talk to your dietitian to identify your daily goals for nutrients listed on the label. Shopping  Avoid buying canned, premade, or processed foods. These foods tend to be high in fat, sodium, and added sugar.  Shop around the outside edge of the grocery store. This includes fresh fruits and vegetables, bulk grains, fresh meats, and fresh dairy. Cooking  Use low-heat cooking methods, such as baking, instead of high-heat cooking methods like deep frying.  Cook using healthy oils, such as olive, canola, or sunflower oil.  Avoid cooking with butter, cream, or high-fat meats. Meal planning  Eat meals and snacks regularly, preferably at the same times every day. Avoid going long periods of time without eating.  Eat foods high in fiber, such as fresh fruits, vegetables, beans, and whole grains. Talk to your dietitian about how many servings of carbs you can eat at each meal.  Eat 4-6 ounces (oz) of lean protein each day, such as lean meat, chicken, fish, eggs, or tofu. One oz of lean protein is equal to: ? 1 oz of meat, chicken, or fish. ? 1 egg. ?  cup of tofu.  Eat some foods each day that contain healthy fats, such as avocado, nuts, seeds, and fish. Lifestyle  Check your blood glucose regularly.  Exercise regularly as told by your health care provider. This may include: ? 150 minutes of moderate-intensity or vigorous-intensity exercise each week. This could be brisk walking, biking, or water aerobics. ? Stretching and doing strength exercises, such as yoga or weightlifting, at least 2 times a week.  Take medicines as told by your health care provider.  Do not use any products that contain nicotine or tobacco, such as cigarettes and e-cigarettes. If you need help quitting, ask your health care provider.  Work with a Social worker or diabetes educator to identify  strategies to manage stress and any emotional and social challenges. Questions to ask a health care provider  Do I need to meet with a diabetes educator?  Do I need to meet with a dietitian?  What number can  I call if I have questions?  When are the best times to check my blood glucose? Where to find more information:  American Diabetes Association: diabetes.org  Academy of Nutrition and Dietetics: www.eatright.[employer] of Diabetes and Digestive and Kidney Diseases (NIH): [employer] Summary  A healthy meal plan will help you control your blood glucose and maintain a healthy lifestyle.  Working with a diet and nutrition specialist (dietitian) can help you make a meal plan that is best for you.  Keep in mind that carbohydrates (carbs) and alcohol have immediate effects on your blood glucose levels. It is important to count carbs and to use alcohol carefully. This information is not intended to replace advice given to you by your health care provider. Make sure you discuss any questions you have with your health care provider. Document Revised: 04/01/2017 Document Reviewed: 05/24/2016 Elsevier Patient Education  2020 [employer].

## 2020-05-06 NOTE — Progress Notes (Signed)
Established patient visit   Patient: Kim Ashley   DOB: 1961/01/11   60 y.o. Female  MRN: [id number] Visit Date: 05/06/2020  Today's healthcare provider: Wilhemena Durie, MD   Chief Complaint  Patient presents with  . Diabetes  . Hypertension  . Hyperlipidemia   Subjective    HPI  Overall patient feels well.  She has not been doing well with her dietary habits of late.  She is tolerating Trulicity at the low-dose.  She is not checking her blood sugars at home. She is admitted to eating poorly this following over the holidays. Diabetes Mellitus Type II, Follow-up  Lab Results  Component Value Date   HGBA1C 8.4 (A) 05/06/2020   HGBA1C 7.9 (H) 12/18/2019   HGBA1C 11.8 (H) 08/16/2019   Wt Readings from Last 3 Encounters:  05/06/20 240 lb (108.9 kg)  12/18/19 225 lb (102.1 kg)  08/16/19 223 lb (101.2 kg)   Last seen for diabetes 4 months ago.  Management since then includes no changes. She reports excellent compliance with treatment. She is not having side effects.  Symptoms: No fatigue No foot ulcerations  No appetite changes No nausea  No paresthesia of the feet  No polydipsia  No polyuria No visual disturbances   No vomiting     Home blood sugar records: fasting range: 150's  Episodes of hypoglycemia? No    Current insulin regiment: none Most Recent Eye Exam: [facility] Current exercise: walking Current diet habits: not asked  Pertinent Labs: Lab Results  Component Value Date   CHOL 122 12/18/2019   HDL 60 12/18/2019   LDLCALC 45 12/18/2019   TRIG 87 12/18/2019   CHOLHDL 2.0 12/18/2019   Lab Results  Component Value Date   NA 140 12/18/2019   K 4.2 12/18/2019   CREATININE 0.76 12/18/2019   GFRNONAA 87 12/18/2019   GFRAA 101 12/18/2019   GLUCOSE 143 (H) 12/18/2019     --------------------------------------------------------------------------------------------------- Hypertension, follow-up  BP Readings from Last 3 Encounters:   05/06/20 126/80  12/18/19 125/82  08/16/19 136/80   Wt Readings from Last 3 Encounters:  05/06/20 240 lb (108.9 kg)  12/18/19 225 lb (102.1 kg)  08/16/19 223 lb (101.2 kg)     She was last seen for hypertension 4 months ago.  BP at that visit was 125/82. Management since that visit includes start losartan 25 mg daily.  She reports excellent compliance with treatment. She is not having side effects.  She is following a Regular diet. She is not exercising. She does not smoke.  Use of agents associated with hypertension: none.   Outside blood pressures are not being checked. Symptoms: No chest pain No chest pressure  No palpitations No syncope  No dyspnea No orthopnea  No paroxysmal nocturnal dyspnea Yes lower extremity edema   Pertinent labs: Lab Results  Component Value Date   CHOL 122 12/18/2019   HDL 60 12/18/2019   LDLCALC 45 12/18/2019   TRIG 87 12/18/2019   CHOLHDL 2.0 12/18/2019   Lab Results  Component Value Date   NA 140 12/18/2019   K 4.2 12/18/2019   CREATININE 0.76 12/18/2019   GFRNONAA 87 12/18/2019   GFRAA 101 12/18/2019   GLUCOSE 143 (H) 12/18/2019     The ASCVD Risk score (Goff DC Jr., et al., 2013) failed to calculate for the following reasons:   The valid total cholesterol range is 130 to 320 mg/dL   --------------------------------------------------------------------------------------------------- Lipid/Cholesterol, Follow-up  Last  lipid panel Other pertinent labs  Lab Results  Component Value Date   CHOL 122 12/18/2019   HDL 60 12/18/2019   LDLCALC 45 12/18/2019   TRIG 87 12/18/2019   CHOLHDL 2.0 12/18/2019   Lab Results  Component Value Date   ALT 20 12/18/2019   AST 16 12/18/2019   PLT 175 12/18/2019   TSH 3.540 12/18/2019     She was last seen for this 4 months ago.  Management since that visit includes no changes.  She reports excellent compliance with treatment. She is not having side effects.   Symptoms: No chest pain  No chest pressure/discomfort  No dyspnea Yes lower extremity edema  No numbness or tingling of extremity No orthopnea  No palpitations No paroxysmal nocturnal dyspnea  No speech difficulty No syncope   Current diet: not asked Current exercise: walking  The ASCVD Risk score Denman George DC Jr., et al., 2013) failed to calculate for the following reasons:   The valid total cholesterol range is 130 to 320 mg/dL  ---------------------------------------------------------------------------------------------------   Patient Active Problem List   Diagnosis Date Noted  . Personal history of colonic polyps   . Benign neoplasm of ascending colon   . Benign neoplasm of sigmoid colon   . Colon polyp 10/30/2014  . Essential (primary) hypertension 10/30/2014  . HLD (hyperlipidemia) 10/30/2014  . Adiposity 10/30/2014  . Obstructive apnea 10/30/2014  . Osteopenia 10/30/2014  . Diabetes mellitus, type 2 (HCC) 10/30/2014   Social History   Tobacco Use  . Smoking status: Never Smoker  . Smokeless tobacco: Never Used  Substance Use Topics  . Alcohol use: No  . Drug use: No   Allergies  Allergen Reactions  . Byetta 10 Mcg Pen [Exenatide] Nausea And Vomiting  . Lisinopril Cough  . Niacin Other (See Comments)    Joint aches, flushing  . Simvastatin Other (See Comments)    Joint aches, flushing       Medications: Outpatient Medications Prior to Visit  Medication Sig  . Dulaglutide (TRULICITY) 0.75 MG/0.5ML SOPN INJECT THE CONTENTS OF 1 PEN( 0.75 MG) UNDER THE SKIN ONCE A WEEK  . glipiZIDE (GLUCOTROL) 10 MG tablet TAKE 1 TABLET(10 MG) BY MOUTH TWICE DAILY  . glucose blood (BAYER CONTOUR NEXT TEST) test strip CONTOUR NEXT EZ. Dx E11.9 Test blood sugar fasting once daily  . Lancets MISC 1 each by Does not apply route daily. Dx E11.9 test once daily  . levothyroxine (SYNTHROID) 25 MCG tablet TAKE 1 TABLET(25 MCG) BY MOUTH DAILY BEFORE BREAKFAST  . losartan (COZAAR) 25 MG tablet Take 1 tablet (25  mg total) by mouth daily.  . metFORMIN (GLUCOPHAGE) 1000 MG tablet TAKE 1 TABLET(1000 MG) BY MOUTH TWICE DAILY WITH A MEAL  . pioglitazone (ACTOS) 30 MG tablet TAKE 1 TABLET(30 MG) BY MOUTH DAILY  . rosuvastatin (CRESTOR) 10 MG tablet TAKE 1 TABLET(10 MG) BY MOUTH DAILY  . loratadine (CLARITIN) 10 MG tablet Take 10 mg by mouth daily. AM (Patient not taking: No sig reported)   No facility-administered medications prior to visit.    Review of Systems  Constitutional: Negative.   Eyes: Negative.   Respiratory: Negative.   Cardiovascular: Negative.   Gastrointestinal: Negative.   Endocrine: Negative.     Last CBC Lab Results  Component Value Date   WBC 6.2 12/18/2019   HGB 13.9 12/18/2019   HCT 42.7 12/18/2019   MCV 87 12/18/2019   MCH 28.4 12/18/2019   RDW 13.4 12/18/2019   PLT 175  12/18/2019      Objective    BP 126/80 (BP Location: Left Arm, Patient Position: Sitting, Cuff Size: Large)   Pulse 84   Temp 98.5 F (36.9 C) (Oral)   Resp 16   Ht 5\' 4"  (1.626 m)   Wt 240 lb (108.9 kg)   SpO2 99%   BMI 41.20 kg/m  BP Readings from Last 3 Encounters:  05/06/20 126/80  12/18/19 125/82  08/16/19 136/80   Wt Readings from Last 3 Encounters:  05/06/20 240 lb (108.9 kg)  12/18/19 225 lb (102.1 kg)  08/16/19 223 lb (101.2 kg)      Physical Exam Vitals reviewed.  Constitutional:      Appearance: Normal appearance.  HENT:     Head: Normocephalic and atraumatic.     Right Ear: External ear normal.     Left Ear: External ear normal.  Eyes:     General: No scleral icterus.    Conjunctiva/sclera: Conjunctivae normal.  Neck:     Vascular: No carotid bruit.  Cardiovascular:     Rate and Rhythm: Normal rate and regular rhythm.     Pulses: Normal pulses.     Heart sounds: Normal heart sounds.  Pulmonary:     Effort: Pulmonary effort is normal.     Breath sounds: Normal breath sounds.  Abdominal:     Palpations: Abdomen is soft.  Musculoskeletal:     Right lower  leg: Edema present.     Left lower leg: No edema.  Lymphadenopathy:     Cervical: No cervical adenopathy.  Skin:    General: Skin is warm and dry.  Neurological:     General: No focal deficit present.     Mental Status: She is alert and oriented to person, place, and time.  Psychiatric:        Mood and Affect: Mood normal.        Behavior: Behavior normal.        Thought Content: Thought content normal.        Judgment: Judgment normal.       Results for orders placed or performed in visit on 05/06/20  POCT glycosylated hemoglobin (Hb A1C)  Result Value Ref Range   Hemoglobin A1C 8.4 (A) 4.0 - 5.6 %   Est. average glucose Bld gHb Est-mCnc 194     Assessment & Plan     1. Type 2 diabetes mellitus without complication, without long-term current use of insulin (HCC) Double Trulicity dose to 1.5.  3 to 4 months.  Diet and exercise habits stressed. - POCT glycosylated hemoglobin (Hb A1C) - Dulaglutide (TRULICITY) [id number] SOPN; Inject 1.5 mg into the skin once a week.  Dispense: 0.5 mL; Refill: 4  2. Essential (primary) hypertension Good control  3. Pure hypercholesterolemia On rosuvastatin 10  4. Class 2 severe obesity due to excess calories with serious comorbidity and body mass index (BMI) of 38.0 to 38.9 in adult [facility]) Now BMI over 40.  Diet and exercise stressed.  5. Need for immunization against influenza Patient up-to-date on COVID-vaccine and booster. - Flu Vaccine QUAD 36+ mos IM   No follow-ups on file.         Tamirra Sienkiewicz Cranford Mon, MD  [facility] [phone] (phone) [phone] (fax)  [place]

## 2020-05-08 ENCOUNTER — Ambulatory Visit: Payer: Self-pay | Admitting: Family Medicine

## 2020-05-27 ENCOUNTER — Other Ambulatory Visit: Payer: Self-pay | Admitting: Family Medicine

## 2020-05-27 DIAGNOSIS — E119 Type 2 diabetes mellitus without complications: Secondary | ICD-10-CM

## 2020-06-14 ENCOUNTER — Other Ambulatory Visit: Payer: Self-pay | Admitting: Family Medicine

## 2020-06-14 DIAGNOSIS — I10 Essential (primary) hypertension: Secondary | ICD-10-CM

## 2020-06-18 ENCOUNTER — Other Ambulatory Visit: Payer: Self-pay

## 2020-06-18 ENCOUNTER — Encounter: Payer: Self-pay | Admitting: Family Medicine

## 2020-06-18 DIAGNOSIS — E119 Type 2 diabetes mellitus without complications: Secondary | ICD-10-CM

## 2020-06-18 MED ORDER — METFORMIN HCL 1000 MG PO TABS: ORAL_TABLET | ORAL | 2 refills | Status: DC

## 2020-07-04 ENCOUNTER — Other Ambulatory Visit: Payer: Self-pay | Admitting: Family Medicine

## 2020-07-04 DIAGNOSIS — E119 Type 2 diabetes mellitus without complications: Secondary | ICD-10-CM

## 2020-08-22 ENCOUNTER — Other Ambulatory Visit: Payer: Self-pay | Admitting: Family Medicine

## 2020-08-22 DIAGNOSIS — E119 Type 2 diabetes mellitus without complications: Secondary | ICD-10-CM

## 2020-08-28 ENCOUNTER — Other Ambulatory Visit: Payer: Self-pay | Admitting: Family Medicine

## 2020-08-28 NOTE — Telephone Encounter (Signed)
Requested Prescriptions  Pending Prescriptions Disp Refills  . levothyroxine (SYNTHROID) 25 MCG tablet [Pharmacy Med Name: LEVOTHYROXINE 0.025MG  (25MCG) TAB] 90 tablet 0    Sig: TAKE 1 TABLET(25 MCG) BY MOUTH DAILY BEFORE BREAKFAST     Endocrinology:  Hypothyroid Agents Failed - 08/28/2020  3:14 AM      Failed - TSH needs to be rechecked within 3 months after an abnormal result. Refill until TSH is due.      Passed - TSH in normal range and within 360 days    TSH  Date Value Ref Range Status  12/18/2019 3.540 0.450 - 4.500 uIU/mL Final         Passed - Valid encounter within last 12 months    Recent Outpatient Visits          3 months ago Type 2 diabetes mellitus without complication, without long-term current use of insulin [facility])   [facility] Jerrol Banana., MD   8 months ago Annual physical exam   [facility] Jerrol Banana., MD   1 year ago Essential (primary) hypertension   [facility] Jerrol Banana., MD   2 years ago Type 2 diabetes mellitus without complication, without long-term current use of insulin [facility])   [facility] Jerrol Banana., MD   2 years ago Type 2 diabetes mellitus without complication, without long-term current use of insulin [facility])   [facility] Jerrol Banana., MD      Future Appointments            In 1 week Jerrol Banana., MD [facility]

## 2020-09-04 ENCOUNTER — Ambulatory Visit: Payer: Self-pay | Admitting: Family Medicine

## 2020-09-18 ENCOUNTER — Ambulatory Visit: Payer: BC Managed Care – PPO | Admitting: Family Medicine

## 2020-09-18 ENCOUNTER — Other Ambulatory Visit: Payer: Self-pay

## 2020-09-18 ENCOUNTER — Encounter: Payer: Self-pay | Admitting: Family Medicine

## 2020-09-18 VITALS — BP 113/76 | HR 92 | Temp 97.2°F | Resp 16 | Wt 239.0 lb

## 2020-09-18 DIAGNOSIS — E7800 Pure hypercholesterolemia, unspecified: Secondary | ICD-10-CM | POA: Diagnosis not present

## 2020-09-18 DIAGNOSIS — Z23 Encounter for immunization: Secondary | ICD-10-CM | POA: Diagnosis not present

## 2020-09-18 DIAGNOSIS — E119 Type 2 diabetes mellitus without complications: Secondary | ICD-10-CM | POA: Diagnosis not present

## 2020-09-18 DIAGNOSIS — I10 Essential (primary) hypertension: Secondary | ICD-10-CM

## 2020-09-18 DIAGNOSIS — Z86010 Personal history of colonic polyps: Secondary | ICD-10-CM

## 2020-09-18 DIAGNOSIS — Z1211 Encounter for screening for malignant neoplasm of colon: Secondary | ICD-10-CM | POA: Diagnosis not present

## 2020-09-18 DIAGNOSIS — R0989 Other specified symptoms and signs involving the circulatory and respiratory systems: Secondary | ICD-10-CM

## 2020-09-18 LAB — POCT GLYCOSYLATED HEMOGLOBIN (HGB A1C)
Est. average glucose Bld gHb Est-mCnc: 171
Hemoglobin A1C: 7.6 % — AB (ref 4.0–5.6)

## 2020-09-18 NOTE — Progress Notes (Signed)
I,Roshena L Chambers,acting as a scribe for Wilhemena Durie, MD.,have documented all relevant documentation on the behalf of Wilhemena Durie, MD,as directed by  Wilhemena Durie, MD while in the presence of Wilhemena Durie, MD.    Established patient visit   Patient: Kim Ashley   DOB: 1960/12/30   60 y.o. Female  MRN: [id number] Visit Date: 09/18/2020  Today's healthcare provider: Wilhemena Durie, MD   Chief Complaint  Patient presents with  . Diabetes  . Hyperlipidemia   Subjective    HPI  Diabetes Mellitus Type II, follow-up  Lab Results  Component Value Date   HGBA1C 7.6 (A) 09/18/2020   HGBA1C 8.4 (A) 05/06/2020   HGBA1C 7.9 (H) 12/18/2019   Last seen for diabetes 4 months ago.  Management since then includes continuing the same treatment. She reports good compliance with treatment. She is not having side effects.   Home blood sugar records: blood sugars are not checked  Episodes of hypoglycemia? No    Current insulin regiment: none Most Recent Eye Exam: not UTD  --------------------------------------------------------------------------------------------------- Hypertension, follow-up  BP Readings from Last 3 Encounters:  09/18/20 113/76  05/06/20 126/80  12/18/19 125/82   Wt Readings from Last 3 Encounters:  09/18/20 239 lb (108.4 kg)  05/06/20 240 lb (108.9 kg)  12/18/19 225 lb (102.1 kg)     She was last seen for hypertension 4 months ago.  BP at that visit was 126/80. Management since that visit includes no medication changes. She reports good compliance with treatment. She is not having side effects.  She is exercising. She is not adherent to low salt diet.   Outside blood pressures are not checked.  She does not smoke.  Use of agents associated with hypertension: NSAIDS.   --------------------------------------------------------------------------------------------------- Lipid/Cholesterol, follow-up  Last Lipid  Panel: Lab Results  Component Value Date   CHOL 122 12/18/2019   LDLCALC 45 12/18/2019   HDL 60 12/18/2019   TRIG 87 12/18/2019    She was last seen for this 4 months ago.  Management since that visit includes no medication changes. On Crestor 10mg .   She reports good compliance with treatment. She is not having side effects.   Symptoms: No appetite changes No foot ulcerations  No chest pain No chest pressure/discomfort  No dyspnea No orthopnea  No fatigue No lower extremity edema  No palpitations No paroxysmal nocturnal dyspnea  No nausea No numbness or tingling of extremity  No polydipsia No polyuria  No speech difficulty No syncope   She is following a Regular diet. Current exercise: walking  Last metabolic panel Lab Results  Component Value Date   GLUCOSE 143 (H) 12/18/2019   NA 140 12/18/2019   K 4.2 12/18/2019   BUN 11 12/18/2019   CREATININE 0.76 12/18/2019   GFRNONAA 87 12/18/2019   GFRAA 101 12/18/2019   CALCIUM 9.5 12/18/2019   AST 16 12/18/2019   ALT 20 12/18/2019   The ASCVD Risk score (Goff DC Jr., et al., 2013) failed to calculate for the following reasons:   The valid total cholesterol range is 130 to 320 mg/dL       Medications: Outpatient Medications Prior to Visit  Medication Sig  . Dulaglutide (TRULICITY) 1.09 NA/3.5TD SOPN Inject 1.5 mg into the skin once a week.  Marland Kitchen glipiZIDE (GLUCOTROL) 10 MG tablet TAKE 1 TABLET(10 MG) BY MOUTH TWICE DAILY  . glucose blood (BAYER CONTOUR NEXT TEST) test strip CONTOUR NEXT EZ. Dx E11.9  Test blood sugar fasting once daily  . Lancets MISC 1 each by Does not apply route daily. Dx E11.9 test once daily  . levothyroxine (SYNTHROID) 25 MCG tablet TAKE 1 TABLET(25 MCG) BY MOUTH DAILY BEFORE BREAKFAST  . loratadine (CLARITIN) 10 MG tablet Take 10 mg by mouth daily. AM  . losartan (COZAAR) 25 MG tablet TAKE 1 TABLET(25 MG) BY MOUTH DAILY  . metFORMIN (GLUCOPHAGE) 1000 MG tablet TAKE 1 TABLET(1000 MG) BY MOUTH  TWICE DAILY WITH A MEAL  . pioglitazone (ACTOS) 30 MG tablet TAKE 1 TABLET(30 MG) BY MOUTH DAILY  . rosuvastatin (CRESTOR) 10 MG tablet TAKE 1 TABLET(10 MG) BY MOUTH DAILY   No facility-administered medications prior to visit.    Review of Systems  Constitutional: Negative for activity change and fatigue.  Respiratory: Negative for cough and shortness of breath.   Cardiovascular: Negative for chest pain, palpitations and leg swelling.  Endocrine: Negative for cold intolerance, heat intolerance, polyphagia and polyuria.  Musculoskeletal: Negative for arthralgias and myalgias.  Neurological: Negative for dizziness, light-headedness and headaches.  Psychiatric/Behavioral: Negative for agitation, self-injury, sleep disturbance and suicidal ideas. The patient is not nervous/anxious.     Last hemoglobin A1c Lab Results  Component Value Date   HGBA1C 7.6 (A) 09/18/2020       Objective    BP 113/76 (BP Location: Left Arm, Patient Position: Sitting, Cuff Size: Large)   Pulse 92   Temp (!) 97.2 F (36.2 C) (Temporal)   Resp 16   Wt 239 lb (108.4 kg)   BMI 41.02 kg/m  BP Readings from Last 3 Encounters:  09/18/20 113/76  05/06/20 126/80  12/18/19 125/82   Wt Readings from Last 3 Encounters:  09/18/20 239 lb (108.4 kg)  05/06/20 240 lb (108.9 kg)  12/18/19 225 lb (102.1 kg)       Physical Exam Vitals reviewed.  Constitutional:      Appearance: Normal appearance.  HENT:     Head: Normocephalic and atraumatic.     Right Ear: External ear normal.     Left Ear: External ear normal.  Eyes:     General: No scleral icterus.    Conjunctiva/sclera: Conjunctivae normal.  Neck:     Vascular: Carotid bruit present.  Cardiovascular:     Rate and Rhythm: Normal rate and regular rhythm.     Pulses: Normal pulses.     Heart sounds: Normal heart sounds.  Pulmonary:     Effort: Pulmonary effort is normal.     Breath sounds: Normal breath sounds.  Abdominal:     Palpations: Abdomen  is soft.  Musculoskeletal:     Right lower leg: Edema present.     Left lower leg: No edema.  Lymphadenopathy:     Cervical: No cervical adenopathy.  Skin:    General: Skin is warm and dry.  Neurological:     General: No focal deficit present.     Mental Status: She is alert and oriented to person, place, and time.  Psychiatric:        Mood and Affect: Mood normal.        Behavior: Behavior normal.        Thought Content: Thought content normal.        Judgment: Judgment normal.       Results for orders placed or performed in visit on 09/18/20  POCT glycosylated hemoglobin (Hb A1C)  Result Value Ref Range   Hemoglobin A1C 7.6 (A) 4.0 - 5.6 %   Est. average  glucose Bld gHb Est-mCnc 171     Assessment & Plan     1. Type 2 diabetes mellitus without complication, without long-term current use of insulin (HCC) A1c is 7.6 today.  Improved and clinically stable on Trulicity and metformin and pioglitazone.  And glipizide - POCT glycosylated hemoglobin (Hb A1C)  2. Essential (primary) hypertension On losartan 25  3. Pure hypercholesterolemia Rosuvastatin 10  4. Colon cancer screening Refer to GI - Ambulatory referral to gastroenterology for colonoscopy  5. Personal history of colonic polyps Refer to GI - Ambulatory referral to gastroenterology for colonoscopy  6. Need for tetanus, diphtheria, and acellular pertussis (Tdap) vaccine in patient of adolescent age or older Date tetanus - Administer Tetanus-diphtheria-acellular pertussis (Tdap) vaccine  7. Left carotid bruit New left carotid bruit.  Asymptomatic.  Refer for Dopplers.  Start aspirin 81 mg daily - US Carotid Duplex Bilateral; Future  Return in about 4 months (around 01/19/2021).      I, Wilhemena Durie, MD, have reviewed all documentation for this visit. The documentation on 09/26/20 for the exam, diagnosis, procedures, and orders are all accurate and complete.    Mandisa Persinger Cranford Mon, MD  [facility] [phone] (phone) [phone] (fax)  [place]

## 2020-09-19 ENCOUNTER — Other Ambulatory Visit: Payer: Self-pay | Admitting: Family Medicine

## 2020-09-19 DIAGNOSIS — E119 Type 2 diabetes mellitus without complications: Secondary | ICD-10-CM

## 2020-09-19 NOTE — Telephone Encounter (Signed)
Requested Prescriptions  Pending Prescriptions Disp Refills  . TRULICITY 1.5 NI/6.2VO SOPN [Pharmacy Med Name: TRULICITY 1.5MG /0.5ML SDP 0.5ML] 2 mL 2    Sig: ADMINISTER 1.5 MG UNDER THE SKIN 1 TIME A WEEK     Endocrinology:  Diabetes - GLP-1 Receptor Agonists Passed - 09/19/2020  3:11 AM      Passed - HBA1C is between 0 and 7.9 and within 180 days    Hemoglobin A1C  Date Value Ref Range Status  09/18/2020 7.6 (A) 4.0 - 5.6 % Final   Hgb A1c MFr Bld  Date Value Ref Range Status  12/18/2019 7.9 (H) 4.8 - 5.6 % Final    Comment:             Prediabetes: 5.7 - 6.4          Diabetes: >6.4          Glycemic control for adults with diabetes: <7.0          Passed - Valid encounter within last 6 months    Recent Outpatient Visits          Yesterday Type 2 diabetes mellitus without complication, without long-term current use of insulin [facility])   [facility] Jerrol Banana., MD   4 months ago Type 2 diabetes mellitus without complication, without long-term current use of insulin [facility])   [facility] Jerrol Banana., MD   9 months ago Annual physical exam   [facility] Jerrol Banana., MD   1 year ago Essential (primary) hypertension   [facility] Jerrol Banana., MD   2 years ago Type 2 diabetes mellitus without complication, without long-term current use of insulin [facility])   [facility] Jerrol Banana., MD      Future Appointments            In 4 months Jerrol Banana., MD [facility], [place]

## 2020-09-29 ENCOUNTER — Other Ambulatory Visit: Payer: Self-pay | Admitting: Family Medicine

## 2020-09-29 DIAGNOSIS — E119 Type 2 diabetes mellitus without complications: Secondary | ICD-10-CM

## 2020-09-30 NOTE — Telephone Encounter (Signed)
Requested Prescriptions  Pending Prescriptions Disp Refills  . pioglitazone (ACTOS) 30 MG tablet [Pharmacy Med Name: PIOGLITAZONE 30MG  TABLETS] 90 tablet 0    Sig: TAKE 1 TABLET(30 MG) BY MOUTH DAILY     Endocrinology:  Diabetes - Glitazones - pioglitazone Passed - 09/29/2020  3:11 AM      Passed - HBA1C is between 0 and 7.9 and within 180 days    Hemoglobin A1C  Date Value Ref Range Status  09/18/2020 7.6 (A) 4.0 - 5.6 % Final   Hgb A1c MFr Bld  Date Value Ref Range Status  12/18/2019 7.9 (H) 4.8 - 5.6 % Final    Comment:             Prediabetes: 5.7 - 6.4          Diabetes: >6.4          Glycemic control for adults with diabetes: <7.0          Passed - Valid encounter within last 6 months    Recent Outpatient Visits          1 week ago Type 2 diabetes mellitus without complication, without long-term current use of insulin [facility])   [facility] Jerrol Banana., MD   4 months ago Type 2 diabetes mellitus without complication, without long-term current use of insulin [facility])   [facility] Jerrol Banana., MD   9 months ago Annual physical exam   [facility] Jerrol Banana., MD   1 year ago Essential (primary) hypertension   [facility] Jerrol Banana., MD   2 years ago Type 2 diabetes mellitus without complication, without long-term current use of insulin [facility])   [facility] Jerrol Banana., MD      Future Appointments            In 1 month  [place]   In 3 months Rosanna Randy, Retia Passe., MD [facility], [place]

## 2020-10-31 ENCOUNTER — Other Ambulatory Visit: Payer: Self-pay

## 2020-10-31 ENCOUNTER — Ambulatory Visit
Admission: RE | Admit: 2020-10-31 | Discharge: 2020-10-31 | Disposition: A | Discharge status: Home / Self Care | Payer: BC Managed Care – PPO | Source: Ambulatory Visit | Attending: Family Medicine | Admitting: Family Medicine

## 2020-10-31 DIAGNOSIS — R0989 Other specified symptoms and signs involving the circulatory and respiratory systems: Secondary | ICD-10-CM | POA: Diagnosis not present

## 2020-11-07 ENCOUNTER — Telehealth (INDEPENDENT_AMBULATORY_CARE_PROVIDER_SITE_OTHER): Payer: BC Managed Care – PPO | Admitting: Gastroenterology

## 2020-11-07 DIAGNOSIS — Z800 Family history of malignant neoplasm of digestive organs: Secondary | ICD-10-CM

## 2020-11-07 DIAGNOSIS — Z86010 Personal history of colonic polyps: Secondary | ICD-10-CM

## 2020-11-07 MED ORDER — CLENPIQ 10-3.5-12 MG-GM -GM/160ML PO SOLN: 1.0000 | Freq: Once | ORAL | 0 refills | Status: AC

## 2020-11-07 NOTE — Progress Notes (Signed)
Gastroenterology Pre-Procedure Review  Request Date: 12/29/20 Requesting Physician: Dr. Allen Norris  PATIENT REVIEW QUESTIONS: The patient responded to the following health history questions as indicated:    1. Are you having any GI issues? no 2. Do you have a personal history of Polyps? yes (04/21/2015) 3. Do you have a family history of Colon Cancer or Polyps? yes (father colon cancer) 4. Diabetes Mellitus? no 5. Joint replacements in the past 12 months?no 6. Major health problems in the past 3 months?no 7. Any artificial heart valves, MVP, or defibrillator?no    MEDICATIONS & ALLERGIES:    Patient reports the following regarding taking any anticoagulation/antiplatelet therapy:   Plavix, Coumadin, Eliquis, Xarelto, Lovenox, Pradaxa, Brilinta, or Effient? no Aspirin? yes (81 mg)  Patient confirms/reports the following medications:  Current Outpatient Medications  Medication Sig Dispense Refill   glipiZIDE (GLUCOTROL) 10 MG tablet TAKE 1 TABLET(10 MG) BY MOUTH TWICE DAILY 60 tablet 2   glucose blood (BAYER CONTOUR NEXT TEST) test strip CONTOUR NEXT EZ. Dx E11.9 Test blood sugar fasting once daily 100 each 3   Lancets MISC 1 each by Does not apply route daily. Dx E11.9 test once daily 100 each 3   levothyroxine (SYNTHROID) 25 MCG tablet TAKE 1 TABLET(25 MCG) BY MOUTH DAILY BEFORE BREAKFAST 90 tablet 0   loratadine (CLARITIN) 10 MG tablet Take 10 mg by mouth daily. AM     losartan (COZAAR) 25 MG tablet TAKE 1 TABLET(25 MG) BY MOUTH DAILY 90 tablet 1   metFORMIN (GLUCOPHAGE) 1000 MG tablet TAKE 1 TABLET(1000 MG) BY MOUTH TWICE DAILY WITH A MEAL 180 tablet 2   pioglitazone (ACTOS) 30 MG tablet TAKE 1 TABLET(30 MG) BY MOUTH DAILY 90 tablet 0   rosuvastatin (CRESTOR) 10 MG tablet TAKE 1 TABLET(10 MG) BY MOUTH DAILY 90 tablet 3   TRULICITY 1.5 RA/3.0NM SOPN ADMINISTER 1.5 MG UNDER THE SKIN 1 TIME A WEEK 2 mL 2   No current facility-administered medications for this visit.    Patient  confirms/reports the following allergies:  Allergies  Allergen Reactions   Byetta 10 Mcg Pen [Exenatide] Nausea And Vomiting   Lisinopril Cough   Niacin Other (See Comments)    Joint aches, flushing   Simvastatin Other (See Comments)    Joint aches, flushing    No orders of the defined types were placed in this encounter.   AUTHORIZATION INFORMATION Primary Insurance: 1D#: Group #:  Secondary Insurance: 1D#: Group #:  SCHEDULE INFORMATION: Date: 12/29/2020 Time: Location: [place]

## 2020-11-20 ENCOUNTER — Other Ambulatory Visit: Payer: Self-pay | Admitting: Family Medicine

## 2020-11-20 DIAGNOSIS — E119 Type 2 diabetes mellitus without complications: Secondary | ICD-10-CM

## 2020-11-20 NOTE — Telephone Encounter (Signed)
Requested Prescriptions  Pending Prescriptions Disp Refills  . glipiZIDE (GLUCOTROL) 10 MG tablet [Pharmacy Med Name: GLIPIZIDE 10MG  TABLETS] 60 tablet 3    Sig: TAKE 1 TABLET(10 MG) BY MOUTH TWICE DAILY     Endocrinology:  Diabetes - Sulfonylureas Passed - 11/20/2020  3:12 AM      Passed - HBA1C is between 0 and 7.9 and within 180 days    Hemoglobin A1C  Date Value Ref Range Status  09/18/2020 7.6 (A) 4.0 - 5.6 % Final   Hgb A1c MFr Bld  Date Value Ref Range Status  12/18/2019 7.9 (H) 4.8 - 5.6 % Final    Comment:             Prediabetes: 5.7 - 6.4          Diabetes: >6.4          Glycemic control for adults with diabetes: <7.0          Passed - Valid encounter within last 6 months    Recent Outpatient Visits          2 months ago Type 2 diabetes mellitus without complication, without long-term current use of insulin [facility])   [facility] Jerrol Banana., MD   6 months ago Type 2 diabetes mellitus without complication, without long-term current use of insulin [facility])   [facility] Jerrol Banana., MD   11 months ago Annual physical exam   [facility] Jerrol Banana., MD   1 year ago Essential (primary) hypertension   [facility] Jerrol Banana., MD   2 years ago Type 2 diabetes mellitus without complication, without long-term current use of insulin [facility])   [facility] Jerrol Banana., MD      Future Appointments            In 2 months Jerrol Banana., MD [facility] [place]

## 2020-11-27 ENCOUNTER — Other Ambulatory Visit: Payer: Self-pay | Admitting: Family Medicine

## 2020-11-27 NOTE — Telephone Encounter (Signed)
Requested Prescriptions  Pending Prescriptions Disp Refills  . levothyroxine (SYNTHROID) 25 MCG tablet [Pharmacy Med Name: LEVOTHYROXINE 0.'025MG'$  (25MCG) TAB] 90 tablet 0    Sig: TAKE 1 TABLET(25 MCG) BY MOUTH DAILY BEFORE BREAKFAST     Endocrinology:  Hypothyroid Agents Failed - 11/27/2020  3:11 AM      Failed - TSH needs to be rechecked within 3 months after an abnormal result. Refill until TSH is due.      Passed - TSH in normal range and within 360 days    TSH  Date Value Ref Range Status  12/18/2019 3.540 0.450 - 4.500 uIU/mL Final         Passed - Valid encounter within last 12 months    Recent Outpatient Visits          2 months ago Type 2 diabetes mellitus without complication, without long-term current use of insulin [facility])   [facility] Jerrol Banana., MD   6 months ago Type 2 diabetes mellitus without complication, without long-term current use of insulin [facility])   [facility] Jerrol Banana., MD   11 months ago Annual physical exam   [facility] Jerrol Banana., MD   1 year ago Essential (primary) hypertension   [facility] Jerrol Banana., MD   2 years ago Type 2 diabetes mellitus without complication, without long-term current use of insulin [facility])   [facility] Jerrol Banana., MD      Future Appointments            In 1 month Jerrol Banana., MD [facility], [place]

## 2020-12-11 ENCOUNTER — Other Ambulatory Visit: Payer: Self-pay | Admitting: Family Medicine

## 2020-12-11 DIAGNOSIS — E119 Type 2 diabetes mellitus without complications: Secondary | ICD-10-CM

## 2020-12-11 DIAGNOSIS — I10 Essential (primary) hypertension: Secondary | ICD-10-CM

## 2020-12-11 NOTE — Telephone Encounter (Signed)
Requested Prescriptions  Pending Prescriptions Disp Refills  . losartan (COZAAR) 25 MG tablet [Pharmacy Med Name: LOSARTAN '25MG'$  TABLETS] 90 tablet 1    Sig: TAKE 1 TABLET(25 MG) BY MOUTH DAILY     Cardiovascular:  Angiotensin Receptor Blockers Failed - 12/11/2020  3:12 AM      Failed - Cr in normal range and within 180 days    Creatinine, Ser  Date Value Ref Range Status  12/18/2019 0.76 0.57 - 1.00 mg/dL Final   Creatinine, Urine  Date Value Ref Range Status  01/13/2017 87 20 - 275 mg/dL Final         Failed - K in normal range and within 180 days    Potassium  Date Value Ref Range Status  12/18/2019 4.2 3.5 - 5.2 mmol/L Final         Passed - Patient is not pregnant      Passed - Last BP in normal range    BP Readings from Last 1 Encounters:  09/18/20 113/76         Passed - Valid encounter within last 6 months    Recent Outpatient Visits          2 months ago Type 2 diabetes mellitus without complication, without long-term current use of insulin [facility])   [facility] Jerrol Banana., MD   7 months ago Type 2 diabetes mellitus without complication, without long-term current use of insulin [facility])   [facility] Jerrol Banana., MD   11 months ago Annual physical exam   [facility] Jerrol Banana., MD   1 year ago Essential (primary) hypertension   [facility] Jerrol Banana., MD   2 years ago Type 2 diabetes mellitus without complication, without long-term current use of insulin [facility])   [facility] Jerrol Banana., MD      Future Appointments            In 1 month Jerrol Banana., MD [facility]           . TRULICITY 1.5 0000000 SOPN [Pharmacy Med Name: TRULICITY 1.'5MG'$ /0.5ML SDP 0.5ML] 2 mL 2    Sig: ADMINISTER 1.5 MG UNDER THE SKIN 1 TIME A WEEK     Endocrinology:  Diabetes - GLP-1 Receptor Agonists Passed - 12/11/2020  3:12 AM       Passed - HBA1C is between 0 and 7.9 and within 180 days    Hemoglobin A1C  Date Value Ref Range Status  09/18/2020 7.6 (A) 4.0 - 5.6 % Final   Hgb A1c MFr Bld  Date Value Ref Range Status  12/18/2019 7.9 (H) 4.8 - 5.6 % Final    Comment:             Prediabetes: 5.7 - 6.4          Diabetes: >6.4          Glycemic control for adults with diabetes: <7.0          Passed - Valid encounter within last 6 months    Recent Outpatient Visits          2 months ago Type 2 diabetes mellitus without complication, without long-term current use of insulin [facility])   [facility] Jerrol Banana., MD   7 months ago Type 2 diabetes mellitus without complication, without long-term current use of insulin [facility])   [facility] Rosanna Randy, Richard L  Brooke Bonito., MD   11 months ago Annual physical exam   [facility] Jerrol Banana., MD   1 year ago Essential (primary) hypertension   [facility] Jerrol Banana., MD   2 years ago Type 2 diabetes mellitus without complication, without long-term current use of insulin [facility])   [facility] Jerrol Banana., MD      Future Appointments            In 1 month Jerrol Banana., MD [facility], [place]

## 2020-12-18 ENCOUNTER — Encounter: Payer: Self-pay | Admitting: Gastroenterology

## 2020-12-29 ENCOUNTER — Ambulatory Visit: Payer: BC Managed Care – PPO | Admitting: Anesthesiology

## 2020-12-29 ENCOUNTER — Other Ambulatory Visit: Payer: Self-pay

## 2020-12-29 ENCOUNTER — Encounter
Admission: RE | Disposition: A | Discharge status: Home / Self Care | Payer: Self-pay | Source: Home / Self Care | Attending: Gastroenterology

## 2020-12-29 ENCOUNTER — Ambulatory Visit
Admission: RE | Admit: 2020-12-29 | Discharge: 2020-12-29 | Disposition: A | Discharge status: Home / Self Care | Payer: BC Managed Care – PPO | Attending: Gastroenterology | Admitting: Gastroenterology

## 2020-12-29 DIAGNOSIS — Z7984 Long term (current) use of oral hypoglycemic drugs: Secondary | ICD-10-CM | POA: Diagnosis not present

## 2020-12-29 DIAGNOSIS — Z86010 Personal history of colonic polyps: Secondary | ICD-10-CM | POA: Diagnosis not present

## 2020-12-29 DIAGNOSIS — Z888 Allergy status to other drugs, medicaments and biological substances status: Secondary | ICD-10-CM | POA: Insufficient documentation

## 2020-12-29 DIAGNOSIS — Z1211 Encounter for screening for malignant neoplasm of colon: Secondary | ICD-10-CM | POA: Diagnosis present

## 2020-12-29 DIAGNOSIS — K635 Polyp of colon: Secondary | ICD-10-CM | POA: Diagnosis not present

## 2020-12-29 DIAGNOSIS — D125 Benign neoplasm of sigmoid colon: Secondary | ICD-10-CM | POA: Insufficient documentation

## 2020-12-29 DIAGNOSIS — Z800 Family history of malignant neoplasm of digestive organs: Secondary | ICD-10-CM | POA: Insufficient documentation

## 2020-12-29 DIAGNOSIS — Z79890 Hormone replacement therapy (postmenopausal): Secondary | ICD-10-CM | POA: Diagnosis not present

## 2020-12-29 DIAGNOSIS — Z79899 Other long term (current) drug therapy: Secondary | ICD-10-CM | POA: Insufficient documentation

## 2020-12-29 HISTORY — PX: COLONOSCOPY WITH PROPOFOL: SHX5780

## 2020-12-29 HISTORY — PX: POLYPECTOMY: SHX5525

## 2020-12-29 LAB — GLUCOSE, CAPILLARY
Glucose-Capillary: 173 mg/dL — ABNORMAL HIGH (ref 70–99)
Glucose-Capillary: 151 mg/dL — ABNORMAL HIGH (ref 70–99)

## 2020-12-29 SURGERY — COLONOSCOPY WITH PROPOFOL
Anesthesia: General | Site: Rectum

## 2020-12-29 MED ORDER — LIDOCAINE HCL (CARDIAC) PF 100 MG/5ML IV SOSY
PREFILLED_SYRINGE | INTRAVENOUS | Status: DC | PRN
  Administered 2020-12-29: 30 mg via INTRAVENOUS

## 2020-12-29 MED ORDER — ONDANSETRON HCL 4 MG/2ML IJ SOLN: 4.0000 mg | Freq: Once | INTRAMUSCULAR | Status: DC | PRN

## 2020-12-29 MED ORDER — LACTATED RINGERS IV SOLN: INTRAVENOUS | Status: DC

## 2020-12-29 MED ORDER — STERILE WATER FOR IRRIGATION IR SOLN: Status: DC | PRN

## 2020-12-29 MED ORDER — ACETAMINOPHEN 10 MG/ML IV SOLN: 1000.0000 mg | Freq: Once | INTRAVENOUS | Status: DC | PRN

## 2020-12-29 MED ORDER — SODIUM CHLORIDE 0.9 % IV SOLN: INTRAVENOUS | Status: DC

## 2020-12-29 MED ORDER — PROPOFOL 10 MG/ML IV BOLUS
INTRAVENOUS | Status: DC | PRN
  Administered 2020-12-29 (×7): 30 mg via INTRAVENOUS
  Administered 2020-12-29: 20 mg via INTRAVENOUS
  Administered 2020-12-29 (×4): 30 mg via INTRAVENOUS
  Administered 2020-12-29: 40 mg via INTRAVENOUS
  Administered 2020-12-29: 30 mg via INTRAVENOUS
  Administered 2020-12-29: 40 mg via INTRAVENOUS
  Administered 2020-12-29: 30 mg via INTRAVENOUS
  Administered 2020-12-29 (×2): 40 mg via INTRAVENOUS
  Administered 2020-12-29 (×3): 30 mg via INTRAVENOUS
  Administered 2020-12-29: 150 mg via INTRAVENOUS
  Administered 2020-12-29 (×2): 30 mg via INTRAVENOUS

## 2020-12-29 SURGICAL SUPPLY — 8 items
GOWN CVR UNV OPN BCK APRN NK (MISCELLANEOUS) ×2 IMPLANT
GOWN ISOL THUMB LOOP REG UNIV (MISCELLANEOUS) ×4
KIT PRC NS LF DISP ENDO (KITS) ×1 IMPLANT
KIT PROCEDURE OLYMPUS (KITS) ×2
MANIFOLD NEPTUNE II (INSTRUMENTS) ×2 IMPLANT
SNARE COLD EXACTO (MISCELLANEOUS) ×1 IMPLANT
TRAP ETRAP POLY (MISCELLANEOUS) ×1 IMPLANT
WATER STERILE IRR 250ML POUR (IV SOLUTION) ×2 IMPLANT

## 2020-12-29 NOTE — Transfer of Care (Signed)
Immediate Anesthesia Transfer of Care Note  Patient: Kim Ashley  Procedure(s) Performed: COLONOSCOPY WITH BIOPSY (Rectum) POLYPECTOMY (Rectum)  Patient Location: PACU  Anesthesia Type: General  Level of Consciousness: awake, alert  and patient cooperative  Airway and Oxygen Therapy: Patient Spontanous Breathing and Patient connected to supplemental oxygen  Post-op Assessment: Post-op Vital signs reviewed, Patient's Cardiovascular Status Stable, Respiratory Function Stable, Patent Airway and No signs of Nausea or vomiting  Post-op Vital Signs: Reviewed and stable  Complications: No notable events documented.

## 2020-12-29 NOTE — Anesthesia Preprocedure Evaluation (Signed)
Anesthesia Evaluation  Patient identified by MRN, date of birth, ID band Patient awake    Reviewed: Allergy & Precautions, NPO status , Patient's Chart, lab work & pertinent test results, reviewed documented beta blocker date and time   History of Anesthesia Complications Negative for: history of anesthetic complications  Airway Mallampati: II  TM Distance: <3 FB Neck ROM: Full    Dental   Pulmonary sleep apnea ,    breath sounds clear to auscultation       Cardiovascular hypertension, (-) angina(-) DOE  Rhythm:Regular Rate:Normal   HLD   Neuro/Psych    GI/Hepatic neg GERD  ,  Endo/Other  diabetes, Type 2  Renal/GU      Musculoskeletal   Abdominal (+) + obese (BMI 41),   Peds  Hematology   Anesthesia Other Findings   Reproductive/Obstetrics                             Anesthesia Physical Anesthesia Plan  ASA: 3  Anesthesia Plan: General   Post-op Pain Management:    Induction: Intravenous  PONV Risk Score and Plan: 3 and Propofol infusion, TIVA and Treatment may vary due to age or medical condition  Airway Management Planned: Natural Airway and Nasal Cannula  Additional Equipment:   Intra-op Plan:   Post-operative Plan:   Informed Consent: I have reviewed the patients History and Physical, chart, labs and discussed the procedure including the risks, benefits and alternatives for the proposed anesthesia with the patient or authorized representative who has indicated his/her understanding and acceptance.       Plan Discussed with: CRNA and Anesthesiologist  Anesthesia Plan Comments:         Anesthesia Quick Evaluation

## 2020-12-29 NOTE — Anesthesia Procedure Notes (Signed)
Date/Time: 12/29/2020 9:17 AM Performed by: Cameron Ali, CRNA Pre-anesthesia Checklist: Patient identified, Emergency Drugs available, Suction available, Timeout performed and Patient being monitored Patient Re-evaluated:Patient Re-evaluated prior to induction Oxygen Delivery Method: Nasal cannula Placement Confirmation: positive ETCO2

## 2020-12-29 NOTE — Anesthesia Postprocedure Evaluation (Signed)
Anesthesia Post Note  Patient: Kim Ashley  Procedure(s) Performed: COLONOSCOPY WITH BIOPSY (Rectum) POLYPECTOMY (Rectum)     Patient location during evaluation: PACU Anesthesia Type: General Level of consciousness: awake and alert Pain management: pain level controlled Vital Signs Assessment: post-procedure vital signs reviewed and stable Respiratory status: spontaneous breathing, nonlabored ventilation, respiratory function stable and patient connected to nasal cannula oxygen Cardiovascular status: blood pressure returned to baseline and stable Postop Assessment: no apparent nausea or vomiting Anesthetic complications: no   No notable events documented.  Kim Ashley  Kim Ashley

## 2020-12-29 NOTE — Op Note (Signed)
[facility] Patient Name: Kim Ashley Procedure Date: 12/29/2020 9:12 AM MRN: [id number] Account #: [id number] Date of Birth: [birth year] Admit Type: Outpatient Age: 60 Room: [facility] OR ROOM 01 Gender: Female Note Status: Finalized Procedure:             Colonoscopy Indications:           High risk colon cancer surveillance: Personal history                         of colonic polyps Providers:             Lucilla Lame MD, MD Referring MD:          Janine Ores. Rosanna Randy, MD (Referring MD) Medicines:             Propofol per Anesthesia Complications:         No immediate complications. Procedure:             Pre-Anesthesia Assessment:                        - Prior to the procedure, a History and Physical was                         performed, and patient medications and allergies were                         reviewed. The patient's tolerance of previous                         anesthesia was also reviewed. The risks and benefits                         of the procedure and the sedation options and risks                         were discussed with the patient. All questions were                         answered, and informed consent was obtained. Prior                         Anticoagulants: The patient has taken no previous                         anticoagulant or antiplatelet agents. ASA Grade                         Assessment: II - A patient with mild systemic disease.                         After reviewing the risks and benefits, the patient                         was deemed in satisfactory condition to undergo the                         procedure.  After obtaining informed consent, the colonoscope was                         passed under direct vision. Throughout the procedure,                         the patient's blood pressure, pulse, and oxygen                         saturations were monitored continuously. The                          Colonoscope was introduced through the anus and                         advanced to the the ascending colon. The patient                         tolerated the procedure well. The quality of the bowel                         preparation was excellent. The colonoscopy was                         extremely difficult due to restricted mobility of the                         colon. The patient tolerated the procedure well. The                         quality of the bowel preparation was excellent. Findings:      The perianal and digital rectal examinations were normal.      A 6 mm polyp was found in the sigmoid colon. The polyp was sessile. The       polyp was removed with a cold snare. Resection and retrieval were       complete. Impression:            - One 6 mm polyp in the sigmoid colon, removed with a                         cold snare. Resected and retrieved. Recommendation:        - Discharge patient to home.                        - Resume previous diet.                        - Continue present medications.                        - Await pathology results.                        - Perform a barium enema at appointment to be                         scheduled. Procedure Code(s):     --- Professional ---  [id number], 52, Colonoscopy, flexible; with removal of                         tumor(s), polyp(s), or other lesion(s) by snare                         technique Diagnosis Code(s):     --- Professional ---                        Z86.010, Personal history of colonic polyps                        K63.5, Polyp of colon CPT copyright 2019 American Medical Association. All rights reserved. The codes documented in this report are preliminary and upon coder review may  be revised to meet current compliance requirements. Lucilla Lame MD, MD 12/29/2020 9:55:16 AM This report has been signed electronically. Number of Addenda: 0 Note Initiated On: 12/29/2020 9:12  AM Scope Withdrawal Time: 0 hours 5 minutes 20 seconds  Total Procedure Duration: 0 hours 32 minutes 40 seconds  Estimated Blood Loss:  Estimated blood loss: none.      [facility]

## 2020-12-29 NOTE — H&P (Signed)
Lucilla Lame, MD [address], [place] [address] [phone] Fax : [phone]  Primary Care Physician:  Jerrol Banana., MD Primary Gastroenterologist:  Dr. Allen Norris  Pre-Procedure History & Physical: HPI:  Kim Ashley is a 60 y.o. female is here for an colonoscopy.   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 Authorizing Provider  glipiZIDE (GLUCOTROL) 10 MG tablet TAKE 1 TABLET(10 MG) BY MOUTH TWICE DAILY 11/20/20  Yes Jerrol Banana., MD  levothyroxine (SYNTHROID) 25 MCG tablet TAKE 1 TABLET(25 MCG) BY MOUTH DAILY BEFORE BREAKFAST 11/27/20  Yes Jerrol Banana., MD  loratadine (CLARITIN) 10 MG tablet Take 10 mg by mouth daily. AM   Yes [provider]  losartan (COZAAR) 25 MG tablet TAKE 1 TABLET(25 MG) BY MOUTH DAILY 12/11/20  Yes Jerrol Banana., MD  metFORMIN (GLUCOPHAGE) 1000 MG tablet TAKE 1 TABLET(1000 MG) BY MOUTH TWICE DAILY WITH A MEAL 06/18/20  Yes Jerrol Banana., MD  pioglitazone (ACTOS) 30 MG tablet TAKE 1 TABLET(30 MG) BY MOUTH DAILY 09/30/20  Yes Jerrol Banana., MD  rosuvastatin (CRESTOR) 10 MG tablet TAKE 1 TABLET(10 MG) BY MOUTH DAILY  10/11/18  Yes Jerrol Banana., MD  TRULICITY 1.5 0000000 SOPN ADMINISTER 1.5 MG UNDER THE SKIN 1 TIME A WEEK 12/11/20  Yes Jerrol Banana., MD  glucose blood (BAYER CONTOUR NEXT TEST) test strip CONTOUR NEXT EZ. Dx E11.9 Test blood sugar fasting once daily 09/01/17   Jerrol Banana., MD  Lancets MISC 1 each by Does not apply route daily. Dx E11.9 test once daily 09/01/17   Jerrol Banana., MD    Allergies as of 11/07/2020 - Review Complete 11/07/2020  Allergen Reaction Noted   Byetta 10 mcg pen [exenatide] Nausea And Vomiting 10/30/2014   Lisinopril Cough 10/30/2014   Niacin Other (See Comments) 10/30/2014   Simvastatin Other (See Comments) 10/30/2014    Family History  Problem Relation Age of Onset   Hypertension Mother    Heart attack Mother    Arrhythmia Mother    Colon cancer Father    Diabetes Father    Stroke Father    Diabetes Brother    Hypertension Brother    Stomach cancer Brother     Social History   Socioeconomic History   Marital status: Married    Spouse name: Yvone Neu   Number of children: 3   Years of education: 14   Highest education level: Not on file  Occupational History   Occupation: Insurance underwriter [employer]  Tobacco Use   Smoking status:  Never   Smokeless tobacco: Never  Vaping Use   Vaping Use: Never used  Substance and Sexual Activity   Alcohol use: No   Drug use: No   Sexual activity: Yes  Other Topics Concern   Not on file  Social History Narrative   Not on file   Social Determinants of Health   Financial Resource Strain: Not on file  Food Insecurity: Not on file  Transportation Needs: Not on file  Physical Activity: Not on file  Stress: Not on file  Social Connections: Not on file  Intimate Partner Violence: Not on file    Review of Systems: See HPI, otherwise negative ROS  Physical Exam: Ht '5\' 4"'$  (1.626 m)   Wt 108.4 kg   BMI 41.02 kg/m  General:   Alert,  pleasant and cooperative in  NAD Head:  Normocephalic and atraumatic. Neck:  Supple; no masses or thyromegaly. Lungs:  Clear throughout to auscultation.    Heart:  Regular rate and rhythm. Abdomen:  Soft, nontender and nondistended. Normal bowel sounds, without guarding, and without rebound.   Neurologic:  Alert and  oriented x4;  grossly normal neurologically.  Impression/Plan: Kim Ashley is here for an colonoscopy to be performed for a history of adenomatous polyps on 2016   Risks, benefits, limitations, and alternatives regarding  colonoscopy have been reviewed with the patient.  Questions have been answered.  All parties agreeable.   Lucilla Lame, MD  12/29/2020, 8:20 AM

## 2020-12-30 ENCOUNTER — Encounter: Payer: Self-pay | Admitting: Gastroenterology

## 2020-12-31 ENCOUNTER — Other Ambulatory Visit: Payer: Self-pay | Admitting: Family Medicine

## 2020-12-31 ENCOUNTER — Encounter: Payer: Self-pay | Admitting: Gastroenterology

## 2020-12-31 DIAGNOSIS — E119 Type 2 diabetes mellitus without complications: Secondary | ICD-10-CM

## 2020-12-31 LAB — SURGICAL PATHOLOGY

## 2020-12-31 NOTE — Telephone Encounter (Signed)
Requested Prescriptions  Pending Prescriptions Disp Refills  . pioglitazone (ACTOS) 30 MG tablet [Pharmacy Med Name: PIOGLITAZONE '30MG'$  TABLETS] 90 tablet 0    Sig: TAKE 1 TABLET(30 MG) BY MOUTH DAILY     Endocrinology:  Diabetes - Glitazones - pioglitazone Passed - 12/31/2020  3:12 AM      Passed - HBA1C is between 0 and 7.9 and within 180 days    Hemoglobin A1C  Date Value Ref Range Status  09/18/2020 7.6 (A) 4.0 - 5.6 % Final   Hgb A1c MFr Bld  Date Value Ref Range Status  12/18/2019 7.9 (H) 4.8 - 5.6 % Final    Comment:             Prediabetes: 5.7 - 6.4          Diabetes: >6.4          Glycemic control for adults with diabetes: <7.0          Passed - Valid encounter within last 6 months    Recent Outpatient Visits          3 months ago Type 2 diabetes mellitus without complication, without long-term current use of insulin [facility])   [facility] Jerrol Banana., MD   7 months ago Type 2 diabetes mellitus without complication, without long-term current use of insulin [facility])   [facility] Jerrol Banana., MD   1 year ago Annual physical exam   [facility] Jerrol Banana., MD   1 year ago Essential (primary) hypertension   [facility] Jerrol Banana., MD   2 years ago Type 2 diabetes mellitus without complication, without long-term current use of insulin [facility])   [facility] Jerrol Banana., MD      Future Appointments            In 2 weeks Jerrol Banana., MD [facility] [place]

## 2021-01-19 ENCOUNTER — Ambulatory Visit: Payer: BC Managed Care – PPO | Admitting: Family Medicine

## 2021-01-19 ENCOUNTER — Other Ambulatory Visit: Payer: Self-pay

## 2021-01-19 ENCOUNTER — Encounter: Payer: Self-pay | Admitting: Family Medicine

## 2021-01-19 VITALS — BP 139/65 | HR 85 | Temp 98.6°F | Ht 64.0 in | Wt 233.8 lb

## 2021-01-19 DIAGNOSIS — Z6838 Body mass index (BMI) 38.0-38.9, adult: Secondary | ICD-10-CM

## 2021-01-19 DIAGNOSIS — E119 Type 2 diabetes mellitus without complications: Secondary | ICD-10-CM | POA: Diagnosis not present

## 2021-01-19 DIAGNOSIS — I10 Essential (primary) hypertension: Secondary | ICD-10-CM | POA: Diagnosis not present

## 2021-01-19 DIAGNOSIS — G4733 Obstructive sleep apnea (adult) (pediatric): Secondary | ICD-10-CM

## 2021-01-19 DIAGNOSIS — Z23 Encounter for immunization: Secondary | ICD-10-CM

## 2021-01-19 DIAGNOSIS — E7800 Pure hypercholesterolemia, unspecified: Secondary | ICD-10-CM

## 2021-01-19 LAB — POCT GLYCOSYLATED HEMOGLOBIN (HGB A1C): Hemoglobin A1C: 7 % — AB (ref 4.0–5.6)

## 2021-01-19 NOTE — Progress Notes (Signed)
Established patient visit   Patient: Kim Ashley   DOB: 27-Jul-1960   60 y.o. Female  MRN: [id number] Visit Date: 01/19/2021  Today's healthcare provider: Wilhemena Durie, MD   No chief complaint on file.  Subjective    HPI  -Patient states she has already received mammogram and pap smear by Dr.Tomblin and will be scheduling to have her eye exam at [facility]. Patient is taking medications as prescribed and is trying to work on diet and exercise. Diabetes Mellitus Type II, follow-up  Lab Results  Component Value Date   HGBA1C 7.6 (A) 09/18/2020   HGBA1C 8.4 (A) 05/06/2020   HGBA1C 7.9 (H) 12/18/2019   Last seen for diabetes 4 months ago.  Management since then includes continuing the same treatment. She reports good compliance with treatment. She is not having side effects.  Home blood sugar records:  none  Episodes of hypoglycemia? No    Current insulin regiment: Most Recent Eye Exam: will be scheduling at [facility] with Orion Modest  --------------------------------------------------------------------------------------------------- Hypertension, follow-up  BP Readings from Last 3 Encounters:  12/29/20 126/64  09/18/20 113/76  05/06/20 126/80   Wt Readings from Last 3 Encounters:  12/29/20 230 lb 3.2 oz (104.4 kg)  09/18/20 239 lb (108.4 kg)  05/06/20 240 lb (108.9 kg)     She was last seen for hypertension 4 months ago.  BP at that visit was 113/76. Management since that visit includes continue the same treatment. She reports good compliance with treatment. She is not having side effects.  She is exercising. She is adherent to low salt diet.   Outside blood pressures are .  She does not smoke.  Use of agents associated with hypertension: .   --------------------------------------------------------------------------------------------------- Lipid/Cholesterol, follow-up  Last Lipid Panel: Lab Results  Component Value Date    CHOL 122 12/18/2019   LDLCALC 45 12/18/2019   HDL 60 12/18/2019   TRIG 87 12/18/2019    She was last seen for this 4 months ago.  Management since that visit includes continue with same treatment.  She reports good compliance with treatment. She is not having side effects.   Symptoms: No appetite changes No foot ulcerations  No chest pain No chest pressure/discomfort  No dyspnea No orthopnea  No fatigue No lower extremity edema  No palpitations No paroxysmal nocturnal dyspnea  No nausea No numbness or tingling of extremity  No polydipsia No polyuria  No speech difficulty No syncope   She is following a Regular, Low Sodium diet. Current exercise: walking  Last metabolic panel Lab Results  Component Value Date   GLUCOSE 143 (H) 12/18/2019   NA 140 12/18/2019   K 4.2 12/18/2019   BUN 11 12/18/2019   CREATININE 0.76 12/18/2019   GFRNONAA 87 12/18/2019   GFRAA 101 12/18/2019   CALCIUM 9.5 12/18/2019   AST 16 12/18/2019   ALT 20 12/18/2019   The ASCVD Risk score (Arnett DK, et al., 2019) failed to calculate for the following reasons:   The valid total cholesterol range is 130 to 320 mg/dL  ---------------------------------------------------------------------------------------------------     Medications: Outpatient Medications Prior to Visit  Medication Sig   glipiZIDE (GLUCOTROL) 10 MG tablet TAKE 1 TABLET(10 MG) BY MOUTH TWICE DAILY   glucose blood (BAYER CONTOUR NEXT TEST) test strip CONTOUR NEXT EZ. Dx E11.9 Test blood sugar fasting once daily   Lancets MISC 1 each by Does not apply route daily. Dx E11.9 test once daily  levothyroxine (SYNTHROID) 25 MCG tablet TAKE 1 TABLET(25 MCG) BY MOUTH DAILY BEFORE BREAKFAST   loratadine (CLARITIN) 10 MG tablet Take 10 mg by mouth daily. AM   losartan (COZAAR) 25 MG tablet TAKE 1 TABLET(25 MG) BY MOUTH DAILY   metFORMIN (GLUCOPHAGE) 1000 MG tablet TAKE 1 TABLET(1000 MG) BY MOUTH TWICE DAILY WITH A MEAL   pioglitazone  (ACTOS) 30 MG tablet TAKE 1 TABLET(30 MG) BY MOUTH DAILY   rosuvastatin (CRESTOR) 10 MG tablet TAKE 1 TABLET(10 MG) BY MOUTH DAILY   TRULICITY 1.5 UU/7.2ZD SOPN ADMINISTER 1.5 MG UNDER THE SKIN 1 TIME A WEEK   No facility-administered medications prior to visit.    Review of Systems  Last hemoglobin A1c Lab Results  Component Value Date   HGBA1C 7.0 (A) 01/19/2021       Objective    There were no vitals taken for this visit. BP Readings from Last 3 Encounters:  01/19/21 139/65  12/29/20 126/64  09/18/20 113/76   Wt Readings from Last 3 Encounters:  01/19/21 233 lb 12.8 oz (106.1 kg)  12/29/20 230 lb 3.2 oz (104.4 kg)  09/18/20 239 lb (108.4 kg)      Physical Exam Vitals reviewed.  Constitutional:      Appearance: Normal appearance.  HENT:     Head: Normocephalic and atraumatic.     Right Ear: External ear normal.     Left Ear: External ear normal.  Eyes:     General: No scleral icterus.    Conjunctiva/sclera: Conjunctivae normal.  Cardiovascular:     Rate and Rhythm: Normal rate and regular rhythm.     Pulses: Normal pulses.     Heart sounds: Normal heart sounds.  Pulmonary:     Effort: Pulmonary effort is normal.     Breath sounds: Normal breath sounds.  Abdominal:     Palpations: Abdomen is soft.  Musculoskeletal:     Left lower leg: Edema present.  Lymphadenopathy:     Cervical: No cervical adenopathy.  Skin:    General: Skin is warm and dry.  Neurological:     General: No focal deficit present.     Mental Status: She is alert and oriented to person, place, and time.  Psychiatric:        Mood and Affect: Mood normal.        Behavior: Behavior normal.        Thought Content: Thought content normal.        Judgment: Judgment normal.      No results found for any visits on 01/19/21.  Assessment & Plan     1. Type 2 diabetes mellitus without complication, without long-term current use of insulin (HCC) Diabetes much improved with an A1c of 7.0.   Continue diet and exercise and continue Trulicity Glucotrol and metformin and Actos.  Consider stopping Glucotrol in the future - POCT HgB A1C  2. Flu vaccine need  - Flu Vaccine QUAD 6+ mos PF IM (Fluarix Quad PF)  3. Essential (primary) hypertension Well-controlled on losartan 25 mg daily  4. Obstructive apnea   5. Pure hypercholesterolemia On rosuvastatin 10 mg daily  6. Class 2 severe obesity due to excess calories with serious comorbidity and body mass index (BMI) of 38.0 to 38.9 in adult [facility]) Praised the patient as she has lost 6 pounds this year.   No follow-ups on file.      I, Wilhemena Durie, MD, have reviewed all documentation for this visit. The documentation on 01/22/21 for  the exam, diagnosis, procedures, and orders are all accurate and complete.    Ainsley Sanguinetti Cranford Mon, MD  [facility] [phone] (phone) [phone] (fax)  [place]

## 2021-02-26 ENCOUNTER — Other Ambulatory Visit: Payer: Self-pay | Admitting: Family Medicine

## 2021-02-26 NOTE — Telephone Encounter (Signed)
Requested medications are due for refill today.  yes  Requested medications are on the active medications list.  yes  Last refill. 11/27/2020  Future visit scheduled.   yes  Notes to clinic.  Labs are expired.

## 2021-03-03 ENCOUNTER — Other Ambulatory Visit: Payer: Self-pay | Admitting: Family Medicine

## 2021-03-03 DIAGNOSIS — E119 Type 2 diabetes mellitus without complications: Secondary | ICD-10-CM

## 2021-03-03 NOTE — Telephone Encounter (Signed)
Requested Prescriptions  Pending Prescriptions Disp Refills  . TRULICITY 1.5 BS/9.6GE SOPN [Pharmacy Med Name: TRULICITY 1.5MG /0.5ML SDP 0.5ML] 2 mL 2    Sig: ADMINISTER 1.5 MG UNDER THE SKIN 1 TIME A WEEK     Endocrinology:  Diabetes - GLP-1 Receptor Agonists Passed - 03/03/2021  3:11 AM      Passed - HBA1C is between 0 and 7.9 and within 180 days    Hemoglobin A1C  Date Value Ref Range Status  01/19/2021 7.0 (A) 4.0 - 5.6 % Final   Hgb A1c MFr Bld  Date Value Ref Range Status  12/18/2019 7.9 (H) 4.8 - 5.6 % Final    Comment:             Prediabetes: 5.7 - 6.4          Diabetes: >6.4          Glycemic control for adults with diabetes: <7.0          Passed - Valid encounter within last 6 months    Recent Outpatient Visits          1 month ago Type 2 diabetes mellitus without complication, without long-term current use of insulin [facility])   [facility] Jerrol Banana., MD   5 months ago Type 2 diabetes mellitus without complication, without long-term current use of insulin [facility])   [facility] Jerrol Banana., MD   10 months ago Type 2 diabetes mellitus without complication, without long-term current use of insulin [facility])   [facility] Jerrol Banana., MD   1 year ago Annual physical exam   [facility] Jerrol Banana., MD   1 year ago Essential (primary) hypertension   [facility] Jerrol Banana., MD      Future Appointments            In 2 months Jerrol Banana., MD [facility] [place]

## 2021-03-12 ENCOUNTER — Other Ambulatory Visit: Payer: Self-pay | Admitting: Family Medicine

## 2021-03-12 DIAGNOSIS — E119 Type 2 diabetes mellitus without complications: Secondary | ICD-10-CM

## 2021-03-22 ENCOUNTER — Other Ambulatory Visit: Payer: Self-pay | Admitting: Family Medicine

## 2021-03-22 DIAGNOSIS — E119 Type 2 diabetes mellitus without complications: Secondary | ICD-10-CM

## 2021-03-23 ENCOUNTER — Encounter: Payer: Self-pay | Admitting: Family Medicine

## 2021-03-24 ENCOUNTER — Telehealth: Payer: BC Managed Care – PPO | Admitting: Physician Assistant

## 2021-03-24 DIAGNOSIS — B9789 Other viral agents as the cause of diseases classified elsewhere: Secondary | ICD-10-CM

## 2021-03-24 DIAGNOSIS — J019 Acute sinusitis, unspecified: Secondary | ICD-10-CM | POA: Diagnosis not present

## 2021-03-24 MED ORDER — BENZONATATE 100 MG PO CAPS: 100.0000 mg | ORAL_CAPSULE | Freq: Three times a day (TID) | ORAL | 0 refills | Status: AC | PRN

## 2021-03-24 MED ORDER — IPRATROPIUM BROMIDE 0.03 % NA SOLN: 2.0000 | Freq: Two times a day (BID) | NASAL | 0 refills | Status: DC

## 2021-03-24 NOTE — Progress Notes (Signed)
Virtual Visit Consent   Kim Ashley, you are scheduled for a virtual visit with a [place] provider today.     Just as with appointments in the office, your consent must be obtained to participate.  Your consent will be active for this visit and any virtual visit you may have with one of our providers in the next 365 days.     If you have a MyChart account, a copy of this consent can be sent to you electronically.  All virtual visits are billed to your insurance company just like a traditional visit in the office.    As this is a virtual visit, video technology does not allow for your provider to perform a traditional examination.  This may limit your provider's ability to fully assess your condition.  If your provider identifies any concerns that need to be evaluated in person or the need to arrange testing (such as labs, EKG, etc.), we will make arrangements to do so.     Although advances in technology are sophisticated, we cannot ensure that it will always work on either your end or our end.  If the connection with a video visit is poor, the visit may have to be switched to a telephone visit.  With either a video or telephone visit, we are not always able to ensure that we have a secure connection.     I need to obtain your verbal consent now.   Are you willing to proceed with your visit today?    Kim Ashley has provided verbal consent on 03/24/2021 for a virtual visit (video or telephone).   Mar Daring, PA-C   Date: 03/24/2021 5:43 PM   Virtual Visit via Video Note   I, Mar Daring, connected with  Kim Ashley  ([id number], 22-Jan-1961) on 03/24/21 at  5:45 PM EST by a video-enabled telemedicine application and verified that I am speaking with the correct person using two identifiers.  Location: Patient: Virtual Visit Location Patient: Home Provider: Virtual Visit Location Provider: Home Office   I discussed the limitations of evaluation and management by  telemedicine and the availability of in person appointments. The patient expressed understanding and agreed to proceed.    History of Present Illness: Kim Ashley is a 61 y.o. who identifies as a female who was assigned female at birth, and is being seen today for possible sinus infection.  HPI: Sinusitis This is a new problem. The current episode started in the past 7 days (3-4 days ago). The problem has been gradually worsening since onset. There has been no fever. Associated symptoms include chills, congestion, coughing, ear pain, sinus pressure and a sore throat. Past treatments include nothing. The treatment provided no relief.     Problems:  Patient Active Problem List   Diagnosis Date Noted   Personal history of colonic polyps    Benign neoplasm of ascending colon    Benign neoplasm of sigmoid colon    Colon polyp 10/30/2014   Essential (primary) hypertension 10/30/2014   HLD (hyperlipidemia) 10/30/2014   Adiposity 10/30/2014   Obstructive apnea 10/30/2014   Osteopenia 10/30/2014   Diabetes mellitus, type 2 ([place]) 10/30/2014    Allergies:  Allergies  Allergen Reactions   Byetta 10 Mcg Pen [Exenatide] Nausea And Vomiting   Lisinopril Cough   Niacin Other (See Comments)    Joint aches, flushing   Simvastatin Other (See Comments)    Joint aches, flushing   Medications:  Current Outpatient  Medications:    benzonatate (TESSALON) 100 MG capsule, Take 1 capsule (100 mg total) by mouth 3 (three) times daily as needed., Disp: 30 capsule, Rfl: 0   ipratropium (ATROVENT) 0.03 % nasal spray, Place 2 sprays into both nostrils every 12 (twelve) hours., Disp: 30 mL, Rfl: 0   aspirin EC 81 MG tablet, Take 81 mg by mouth daily. Swallow whole., Disp: , Rfl:    glipiZIDE (GLUCOTROL) 10 MG tablet, TAKE 1 TABLET(10 MG) BY MOUTH TWICE DAILY, Disp: 180 tablet, Rfl: 3   glucose blood (BAYER CONTOUR NEXT TEST) test strip, CONTOUR NEXT EZ. Dx E11.9 Test blood sugar fasting once daily, Disp: 100  each, Rfl: 3   Lancets MISC, 1 each by Does not apply route daily. Dx E11.9 test once daily, Disp: 100 each, Rfl: 3   levothyroxine (SYNTHROID) 25 MCG tablet, TAKE 1 TABLET(25 MCG) BY MOUTH DAILY BEFORE AND BREAKFAST, Disp: 90 tablet, Rfl: 0   loratadine (CLARITIN) 10 MG tablet, Take 10 mg by mouth daily. AM, Disp: , Rfl:    losartan (COZAAR) 25 MG tablet, TAKE 1 TABLET(25 MG) BY MOUTH DAILY, Disp: 90 tablet, Rfl: 1   metFORMIN (GLUCOPHAGE) 1000 MG tablet, TAKE 1 TABLET(1000 MG) BY MOUTH TWICE DAILY WITH A MEAL, Disp: 180 tablet, Rfl: 2   Multiple Vitamin (MULTIVITAMIN PO), Take by mouth., Disp: , Rfl:    pioglitazone (ACTOS) 30 MG tablet, TAKE 1 TABLET(30 MG) BY MOUTH DAILY, Disp: 90 tablet, Rfl: 0   rosuvastatin (CRESTOR) 10 MG tablet, TAKE 1 TABLET(10 MG) BY MOUTH DAILY, Disp: 90 tablet, Rfl: 3   TRULICITY 1.5 MW/1.0UV SOPN, ADMINISTER 1.5 MG UNDER THE SKIN 1 TIME A WEEK, Disp: 2 mL, Rfl: 2  Observations/Objective: Patient is well-developed, well-nourished in no acute distress.  Resting comfortably at home.  Head is normocephalic, atraumatic.  No labored breathing.  Speech is clear and coherent with logical content.  Patient is alert and oriented at baseline.    Assessment and Plan: 1. Acute viral sinusitis - benzonatate (TESSALON) 100 MG capsule; Take 1 capsule (100 mg total) by mouth 3 (three) times daily as needed.  Dispense: 30 capsule; Refill: 0 - ipratropium (ATROVENT) 0.03 % nasal spray; Place 2 sprays into both nostrils every 12 (twelve) hours.  Dispense: 30 mL; Refill: 0  - Suspect viral infection but should closely monitor for changes due to other comorbidities - WIll add tessalon for cough and ipratropium for nasal congestion and drainage - Can add Mucinex - Tylenol as needed for pain/fevers - Warm liquids - Push fluids - Rest - Seek in person evaluation if not improving or worsening   Follow Up Instructions: I discussed the assessment and treatment plan with the  patient. The patient was provided an opportunity to ask questions and all were answered. The patient agreed with the plan and demonstrated an understanding of the instructions.  A copy of instructions were sent to the patient via MyChart unless otherwise noted below.    The patient was advised to call back or seek an in-person evaluation if the symptoms worsen or if the condition fails to improve as anticipated.  Time:  I spent 10 minutes with the patient via telehealth technology discussing the above problems/concerns.    Mar Daring, PA-C

## 2021-03-24 NOTE — Patient Instructions (Signed)
Kim Ashley, thank you for joining Mar Daring, PA-C for today's virtual visit.  While this provider is not your primary care provider (PCP), if your PCP is located in our provider database this encounter information will be shared with them immediately following your visit.  Consent: (Patient) Kim Ashley provided verbal consent for this virtual visit at the beginning of the encounter.  Current Medications:  Current Outpatient Medications:    benzonatate (TESSALON) 100 MG capsule, Take 1 capsule (100 mg total) by mouth 3 (three) times daily as needed., Disp: 30 capsule, Rfl: 0   ipratropium (ATROVENT) 0.03 % nasal spray, Place 2 sprays into both nostrils every 12 (twelve) hours., Disp: 30 mL, Rfl: 0   aspirin EC 81 MG tablet, Take 81 mg by mouth daily. Swallow whole., Disp: , Rfl:    glipiZIDE (GLUCOTROL) 10 MG tablet, TAKE 1 TABLET(10 MG) BY MOUTH TWICE DAILY, Disp: 180 tablet, Rfl: 3   glucose blood (BAYER CONTOUR NEXT TEST) test strip, CONTOUR NEXT EZ. Dx E11.9 Test blood sugar fasting once daily, Disp: 100 each, Rfl: 3   Lancets MISC, 1 each by Does not apply route daily. Dx E11.9 test once daily, Disp: 100 each, Rfl: 3   levothyroxine (SYNTHROID) 25 MCG tablet, TAKE 1 TABLET(25 MCG) BY MOUTH DAILY BEFORE AND BREAKFAST, Disp: 90 tablet, Rfl: 0   loratadine (CLARITIN) 10 MG tablet, Take 10 mg by mouth daily. AM, Disp: , Rfl:    losartan (COZAAR) 25 MG tablet, TAKE 1 TABLET(25 MG) BY MOUTH DAILY, Disp: 90 tablet, Rfl: 1   metFORMIN (GLUCOPHAGE) 1000 MG tablet, TAKE 1 TABLET(1000 MG) BY MOUTH TWICE DAILY WITH A MEAL, Disp: 180 tablet, Rfl: 2   Multiple Vitamin (MULTIVITAMIN PO), Take by mouth., Disp: , Rfl:    pioglitazone (ACTOS) 30 MG tablet, TAKE 1 TABLET(30 MG) BY MOUTH DAILY, Disp: 90 tablet, Rfl: 0   rosuvastatin (CRESTOR) 10 MG tablet, TAKE 1 TABLET(10 MG) BY MOUTH DAILY, Disp: 90 tablet, Rfl: 3   TRULICITY 1.5 HW/2.9HB SOPN, ADMINISTER 1.5 MG UNDER THE SKIN 1 TIME A WEEK,  Disp: 2 mL, Rfl: 2   Medications ordered in this encounter:  Meds ordered this encounter  Medications   benzonatate (TESSALON) 100 MG capsule    Sig: Take 1 capsule (100 mg total) by mouth 3 (three) times daily as needed.    Dispense:  30 capsule    Refill:  0    Order Specific Question:   Supervising Provider    Answer:   MILLER, BRIAN [[id number]]   ipratropium (ATROVENT) 0.03 % nasal spray    Sig: Place 2 sprays into both nostrils every 12 (twelve) hours.    Dispense:  30 mL    Refill:  0    Order Specific Question:   Supervising Provider    Answer:   Sabra Heck, BRIAN [[id number]]     *If you need refills on other medications prior to your next appointment, please contact your pharmacy*  Follow-Up: Call back or seek an in-person evaluation if the symptoms worsen or if the condition fails to improve as anticipated.  Other Instructions Sinusitis, Adult Sinusitis is inflammation of your sinuses. Sinuses are hollow spaces in the bones around your face. Your sinuses are located: Around your eyes. In the middle of your forehead. Behind your nose. In your cheekbones. Mucus normally drains out of your sinuses. When your nasal tissues become inflamed or swollen, mucus can become trapped or blocked. This allows bacteria, viruses, and fungi to  grow, which leads to infection. Most infections of the sinuses are caused by a virus. Sinusitis can develop quickly. It can last for up to 4 weeks (acute) or for more than 12 weeks (chronic). Sinusitis often develops after a cold. What are the causes? This condition is caused by anything that creates swelling in the sinuses or stops mucus from draining. This includes: Allergies. Asthma. Infection from bacteria or viruses. Deformities or blockages in your nose or sinuses. Abnormal growths in the nose (nasal polyps). Pollutants, such as chemicals or irritants in the air. Infection from fungi (rare). What increases the risk? You are more likely to develop this  condition if you: Have a weak body defense system (immune system). Do a lot of swimming or diving. Overuse nasal sprays. Smoke. What are the signs or symptoms? The main symptoms of this condition are pain and a feeling of pressure around the affected sinuses. Other symptoms include: Stuffy nose or congestion. Thick drainage from your nose. Swelling and warmth over the affected sinuses. Headache. Upper toothache. A cough that may get worse at night. Extra mucus that collects in the throat or the back of the nose (postnasal drip). Decreased sense of smell and taste. Fatigue. A fever. Sore throat. Bad breath. How is this diagnosed? This condition is diagnosed based on: Your symptoms. Your medical history. A physical exam. Tests to find out if your condition is acute or chronic. This may include: Checking your nose for nasal polyps. Viewing your sinuses using a device that has a light (endoscope). Testing for allergies or bacteria. Imaging tests, such as an MRI or CT scan. In rare cases, a bone biopsy may be done to rule out more serious types of fungal sinus disease. How is this treated? Treatment for sinusitis depends on the cause and whether your condition is chronic or acute. If caused by a virus, your symptoms should go away on their own within 10 days. You may be given medicines to relieve symptoms. They include: Medicines that shrink swollen nasal passages (topical intranasal decongestants). Medicines that treat allergies (antihistamines). A spray that eases inflammation of the nostrils (topical intranasal corticosteroids). Rinses that help get rid of thick mucus in your nose (nasal saline washes). If caused by bacteria, your health care provider may recommend waiting to see if your symptoms improve. Most bacterial infections will get better without antibiotic medicine. You may be given antibiotics if you have: A severe infection. A weak immune system. If caused by narrow  nasal passages or nasal polyps, you may need to have surgery. Follow these instructions at home: Medicines Take, use, or apply over-the-counter and prescription medicines only as told by your health care provider. These may include nasal sprays. If you were prescribed an antibiotic medicine, take it as told by your health care provider. Do not stop taking the antibiotic even if you start to feel better. Hydrate and humidify  Drink enough fluid to keep your urine pale yellow. Staying hydrated will help to thin your mucus. Use a cool mist humidifier to keep the humidity level in your home above 50%. Inhale steam for 10-15 minutes, 3-4 times a day, or as told by your health care provider. You can do this in the bathroom while a hot shower is running. Limit your exposure to cool or dry air. Rest Rest as much as possible. Sleep with your head raised (elevated). Make sure you get enough sleep each night. General instructions  Apply a warm, moist washcloth to your face 3-4 times  a day or as told by your health care provider. This will help with discomfort. Wash your hands often with soap and water to reduce your exposure to germs. If soap and water are not available, use hand sanitizer. Do not smoke. Avoid being around people who are smoking (secondhand smoke). Keep all follow-up visits as told by your health care provider. This is important. Contact a health care provider if: You have a fever. Your symptoms get worse. Your symptoms do not improve within 10 days. Get help right away if: You have a severe headache. You have persistent vomiting. You have severe pain or swelling around your face or eyes. You have vision problems. You develop confusion. Your neck is stiff. You have trouble breathing. Summary Sinusitis is soreness and inflammation of your sinuses. Sinuses are hollow spaces in the bones around your face. This condition is caused by nasal tissues that become inflamed or swollen.  The swelling traps or blocks the flow of mucus. This allows bacteria, viruses, and fungi to grow, which leads to infection. If you were prescribed an antibiotic medicine, take it as told by your health care provider. Do not stop taking the antibiotic even if you start to feel better. Keep all follow-up visits as told by your health care provider. This is important. This information is not intended to replace advice given to you by your health care provider. Make sure you discuss any questions you have with your health care provider. Document Revised: 09/19/2017 Document Reviewed: 09/19/2017 Elsevier Patient Education  2022 [employer].    If you have been instructed to have an in-person evaluation today at a local Urgent Care facility, please use the link below. It will take you to a list of all of our available [facility], including address, phone number and hours of operation. Please do not delay care.  [facility]  If you or a family member do not have a primary care provider, use the link below to schedule a visit and establish care. When you choose a [facility] primary care physician or advanced practice provider, you gain a long-term partner in health. Find a Primary Care Provider  Learn more about [facility] in-office and virtual care options: [facility]

## 2021-03-30 LAB — HM DIABETES EYE EXAM

## 2021-04-03 ENCOUNTER — Telehealth: Payer: Self-pay | Admitting: Family Medicine

## 2021-04-03 NOTE — Telephone Encounter (Signed)
Called and spoke with patient on the phone who reports that she was seen for virtual visit by Fenton Malling PA on 03/24/21 and diagnosed with sinusitis and prescribed Tessalon and nasal spray. Patient reports since visit symptoms have been improving but reports that congestion and sinus pressure have not, patient reports green colored mucous and sinus pressure. Patient states that she has been using otc Mucinex as recommended by Mrs. Burnette but states that mucinex and nasal spray are not helping. Patient request antibiotic to be called into [facility] in [place]. Patient has declined appt for in person or virtual. Kim Ashley

## 2021-04-03 NOTE — Telephone Encounter (Signed)
Pt is calling to request an antibotic as she has bright green mucus, sinus pressure in the face in the brow area, pt is blowing her nose constantly. Cough is better. Preferred Pharmacy- [facility] [place]  Pt had virtual appt the end of November. Advised pt that she would need to schedule an appt. Pt declined. CB- [phone]

## 2021-04-04 ENCOUNTER — Other Ambulatory Visit: Payer: Self-pay | Admitting: Family Medicine

## 2021-04-04 DIAGNOSIS — E119 Type 2 diabetes mellitus without complications: Secondary | ICD-10-CM

## 2021-04-04 NOTE — Telephone Encounter (Signed)
Requested Prescriptions  Pending Prescriptions Disp Refills  . pioglitazone (ACTOS) 30 MG tablet [Pharmacy Med Name: PIOGLITAZONE 30MG  TABLETS] 90 tablet 0    Sig: TAKE 1 TABLET(30 MG) BY MOUTH DAILY     Endocrinology:  Diabetes - Glitazones - pioglitazone Passed - 04/04/2021  3:13 AM      Passed - HBA1C is between 0 and 7.9 and within 180 days    Hemoglobin A1C  Date Value Ref Range Status  01/19/2021 7.0 (A) 4.0 - 5.6 % Final   Hgb A1c MFr Bld  Date Value Ref Range Status  12/18/2019 7.9 (H) 4.8 - 5.6 % Final    Comment:             Prediabetes: 5.7 - 6.4          Diabetes: >6.4          Glycemic control for adults with diabetes: <7.0          Passed - Valid encounter within last 6 months    Recent Outpatient Visits          2 months ago Type 2 diabetes mellitus without complication, without long-term current use of insulin [facility])   [facility] Jerrol Banana., MD   6 months ago Type 2 diabetes mellitus without complication, without long-term current use of insulin [facility])   [facility] Jerrol Banana., MD   11 months ago Type 2 diabetes mellitus without complication, without long-term current use of insulin [facility])   [facility] Jerrol Banana., MD   1 year ago Annual physical exam   [facility] Jerrol Banana., MD   1 year ago Essential (primary) hypertension   [facility] Jerrol Banana., MD      Future Appointments            In 1 month Jerrol Banana., MD [facility] [place]

## 2021-04-06 NOTE — Telephone Encounter (Signed)
Please advise 

## 2021-04-06 NOTE — Telephone Encounter (Signed)
Patient was advised and patient declined.

## 2021-05-03 ENCOUNTER — Other Ambulatory Visit: Payer: Self-pay | Admitting: Physician Assistant

## 2021-05-03 DIAGNOSIS — B9789 Other viral agents as the cause of diseases classified elsewhere: Secondary | ICD-10-CM

## 2021-05-05 ENCOUNTER — Other Ambulatory Visit: Payer: Self-pay | Admitting: Family Medicine

## 2021-05-05 DIAGNOSIS — J019 Acute sinusitis, unspecified: Secondary | ICD-10-CM

## 2021-05-05 DIAGNOSIS — B9789 Other viral agents as the cause of diseases classified elsewhere: Secondary | ICD-10-CM

## 2021-05-06 NOTE — Telephone Encounter (Signed)
Refilled 05/05/2021. Requested Prescriptions  Pending Prescriptions Disp Refills   ipratropium (ATROVENT) 0.03 % nasal spray [Pharmacy Med Name: IPRATROPIUM 0.03% NAS [id number] (345)] 60 mL     Sig: USE 2 SPRAYS IN EACH NOSTRIL EVERY 12 HOURS     Off-Protocol Failed - 05/05/2021  4:18 PM      Failed - Medication not assigned to a protocol, review manually.      Passed - Valid encounter within last 12 months    Recent Outpatient Visits          3 months ago Type 2 diabetes mellitus without complication, without long-term current use of insulin [facility])   [facility] Jerrol Banana., MD   7 months ago Type 2 diabetes mellitus without complication, without long-term current use of insulin [facility])   [facility] Jerrol Banana., MD   1 year ago Type 2 diabetes mellitus without complication, without long-term current use of insulin [facility]   [facility] Jerrol Banana., MD   1 year ago Annual physical exam   [facility] Jerrol Banana., MD   1 year ago Essential (primary) hypertension   [facility] Jerrol Banana[facility] MD      Future Appointments            In 2 months Jerrol Banana., MD [facility]          Off-Protocol Failed - 05/05/2021  4:18 PM      Failed - Medication not assigned to a protocol, review manually.      Passed - Valid encounter within last 12 months    Recent Outpatient Visits          3 months ago Type 2 diabetes mellitus without complication, without long-term current use of insulin [facility])   [facility] Jerrol Banana., MD   7 months ago Type 2 diabetes mellitus without complication, without long-term current use of insulin [facility])   [facility] Jerrol Banana., MD   1 year ago Type 2 diabetes mellitus without complication, without long-term current use of insulin [facility])   [facility]  Jerrol Banana., MD   1 year ago Annual physical exam   [facility] Jerrol Banana., MD   1 year ago Essential (primary) hypertension   [facility] Jerrol Banana., MD      Future Appointments            In 2 months Jerrol Banana., MD [facility] [place]

## 2021-05-21 ENCOUNTER — Ambulatory Visit: Payer: BC Managed Care – PPO | Admitting: Family Medicine

## 2021-05-30 ENCOUNTER — Other Ambulatory Visit: Payer: Self-pay | Admitting: Family Medicine

## 2021-05-30 NOTE — Telephone Encounter (Signed)
Requested medications are due for refill today.  yes  Requested medications are on the active medications list.  yes  Last refill. 02/26/2021 #90/0  Future visit scheduled.   no  Notes to clinic.  Labs are expired.    Requested Prescriptions  Pending Prescriptions Disp Refills   levothyroxine (SYNTHROID) 25 MCG tablet [Pharmacy Med Name: LEVOTHYROXINE 0.025MG  (25MCG) TAB] 90 tablet 0    Sig: TAKE 1 TABLET(25 MCG) BY MOUTH DAILY BEFORE AND BREAKFAST     Endocrinology:  Hypothyroid Agents Failed - 05/30/2021  3:11 AM      Failed - TSH needs to be rechecked within 3 months after an abnormal result. Refill until TSH is due.      Failed - TSH in normal range and within 360 days    TSH  Date Value Ref Range Status  12/18/2019 3.540 0.450 - 4.500 uIU/mL Final          Passed - Valid encounter within last 12 months    Recent Outpatient Visits           4 months ago Type 2 diabetes mellitus without complication, without long-term current use of insulin [facility])   [facility] Jerrol Banana., MD   8 months ago Type 2 diabetes mellitus without complication, without long-term current use of insulin [facility])   [facility] Jerrol Banana., MD   1 year ago Type 2 diabetes mellitus without complication, without long-term current use of insulin [facility])   [facility] Jerrol Banana., MD   1 year ago Annual physical exam   [facility] Jerrol Banana., MD   1 year ago Essential (primary) hypertension   [facility] Jerrol Banana., MD

## 2021-06-11 ENCOUNTER — Other Ambulatory Visit: Payer: Self-pay | Admitting: Family Medicine

## 2021-06-11 DIAGNOSIS — I10 Essential (primary) hypertension: Secondary | ICD-10-CM

## 2021-06-11 NOTE — Telephone Encounter (Signed)
Has newer rx at same pharm

## 2021-07-11 ENCOUNTER — Other Ambulatory Visit: Payer: Self-pay | Admitting: Family Medicine

## 2021-07-11 DIAGNOSIS — E119 Type 2 diabetes mellitus without complications: Secondary | ICD-10-CM

## 2021-07-30 ENCOUNTER — Ambulatory Visit: Payer: BC Managed Care – PPO | Admitting: Family Medicine

## 2021-12-16 ENCOUNTER — Other Ambulatory Visit: Payer: Self-pay | Admitting: Family

## 2021-12-16 DIAGNOSIS — E119 Type 2 diabetes mellitus without complications: Secondary | ICD-10-CM

## 2022-04-21 ENCOUNTER — Other Ambulatory Visit: Payer: Self-pay | Admitting: Family

## 2022-04-21 DIAGNOSIS — E119 Type 2 diabetes mellitus without complications: Secondary | ICD-10-CM

## 2022-12-24 IMAGING — US US CAROTID DUPLEX BILAT
1 series · 13 of 24 positions shown · non-contrast
Comparison: None.

CLINICAL DATA: Hypertension, asymptomatic left carotid bruit

EXAM:
BILATERAL CAROTID DUPLEX ULTRASOUND
TECHNIQUE: Gray scale imaging, color Doppler and duplex ultrasound were
performed of bilateral carotid and vertebral arteries in the neck.

[Series 1: us carotid duplex bilat · 0.06mm/px · 13 of 64 slices shown]
[im 1/64]
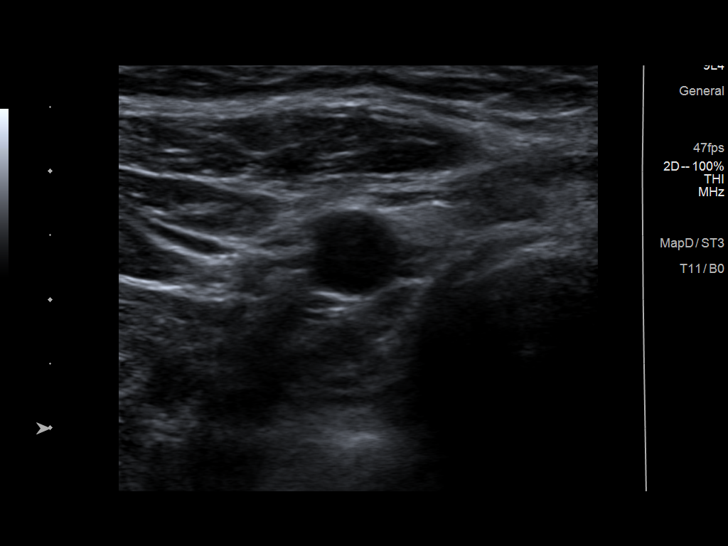
[im 6/64]
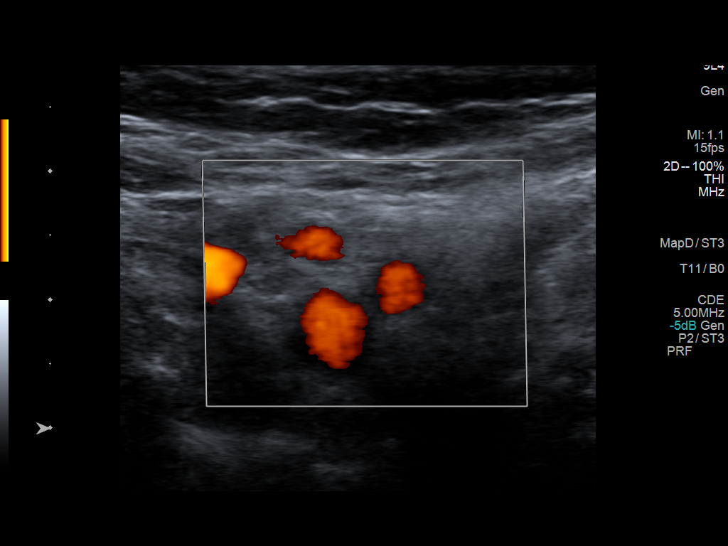
[im 11/64]
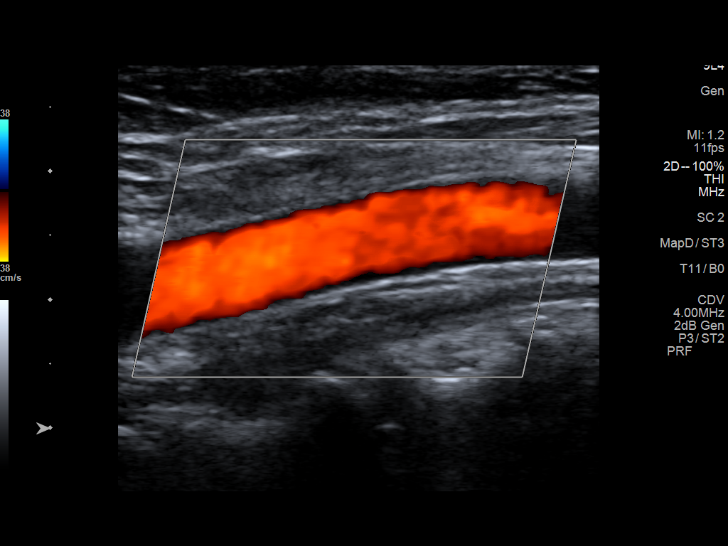
[im 17/64]
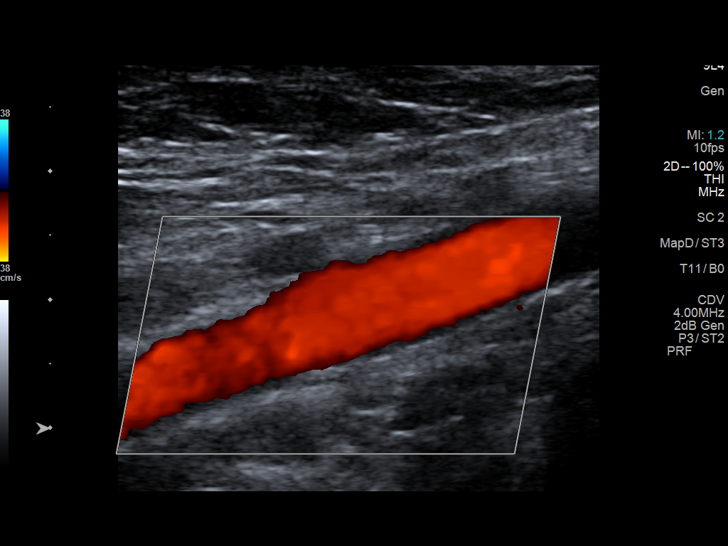
[im 22/64]
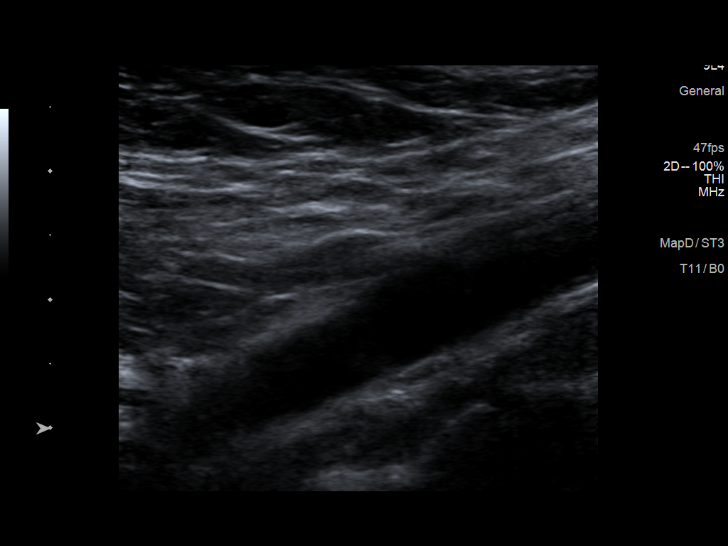
[im 28/64]
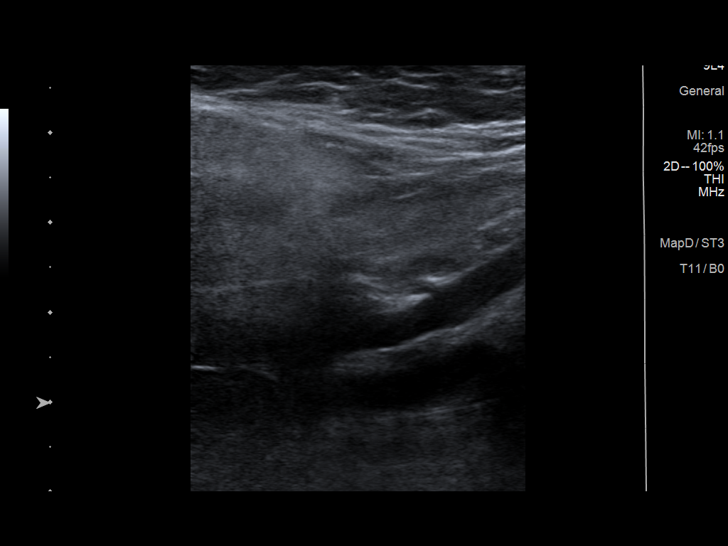
[im 33/64]
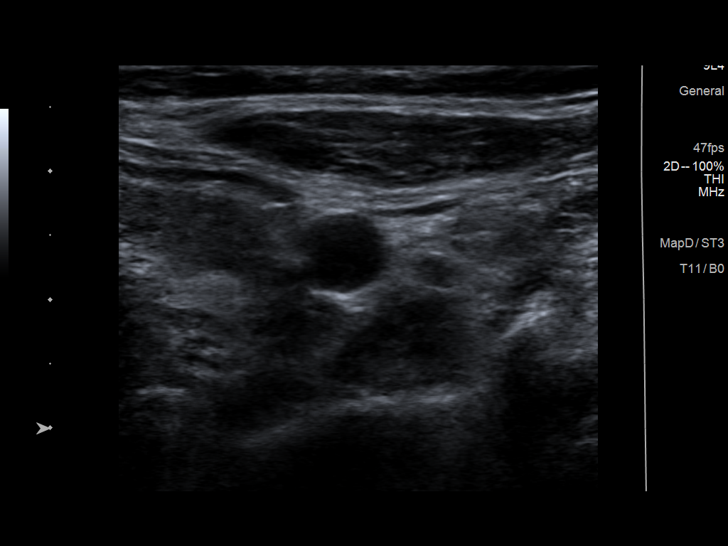
[im 36/64]
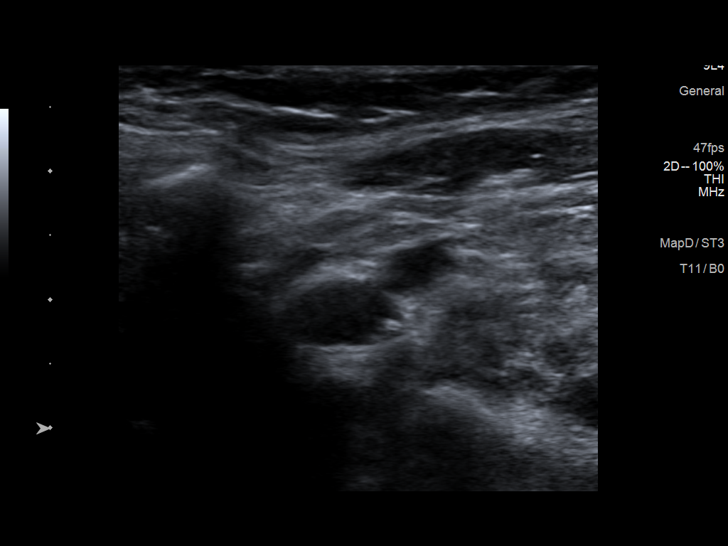
[im 42/64]
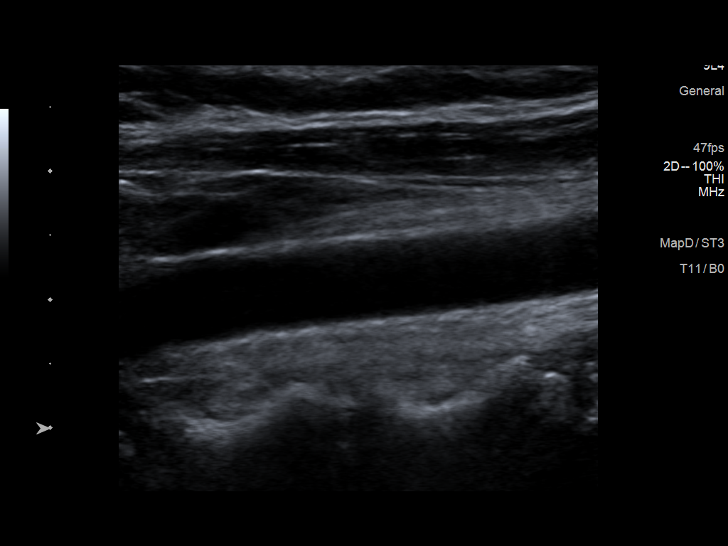
[im 47/64]
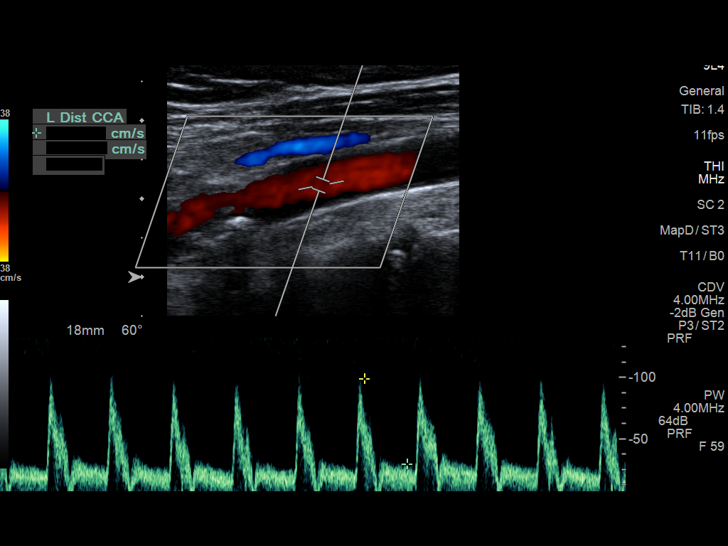
[im 53/64]
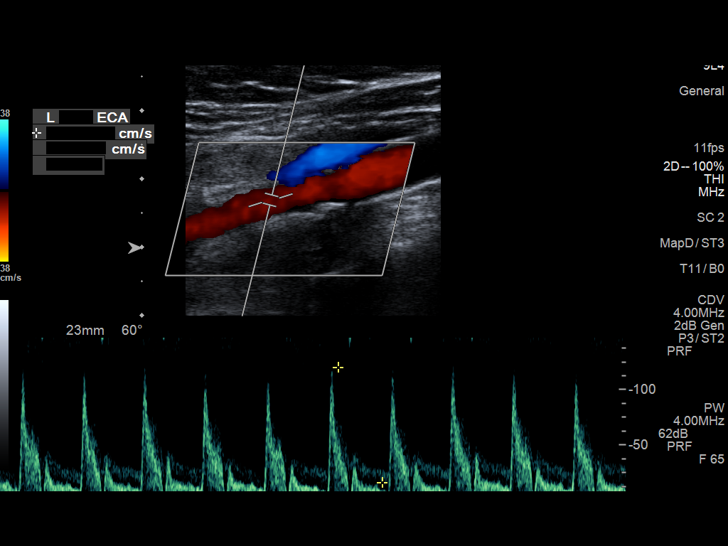
[im 58/64]
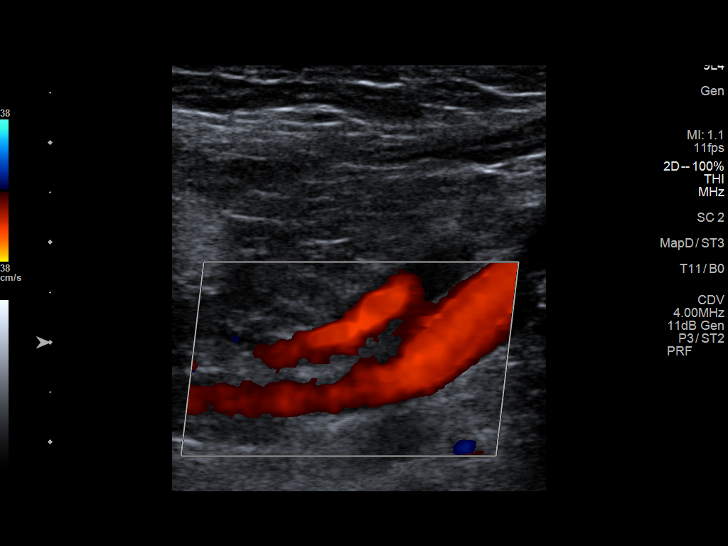
[im 64/64]
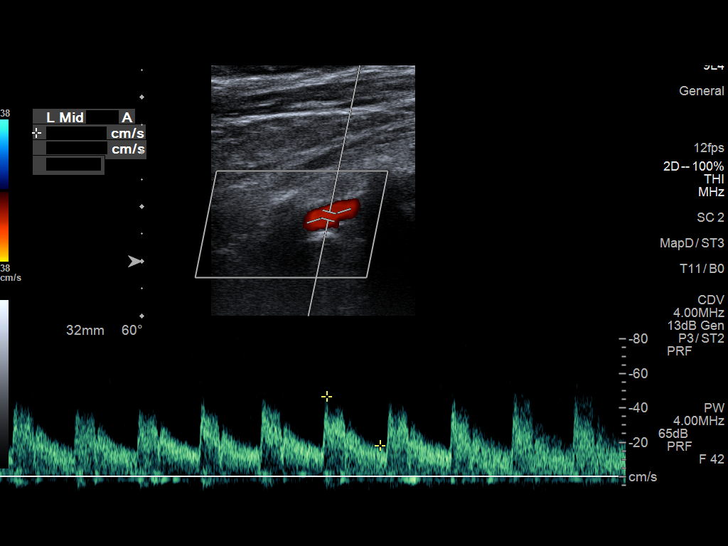

[13 of 24 positions shown; findings below may reference images not displayed]

FINDINGS: Criteria: Quantification of carotid stenosis is based on velocity
parameters that correlate the residual internal carotid diameter
with NASCET-based stenosis levels, using the diameter of the distal
internal carotid lumen as the denominator for stenosis measurement.

The following velocity measurements were obtained:

RIGHT

ICA: 96/31 cm/sec

CCA: 90/23 cm/sec

SYSTOLIC ICA/CCA RATIO:

ECA: 90 cm/sec

LEFT

ICA: 103/34 cm/sec

CCA: 99/30 cm/sec

SYSTOLIC ICA/CCA RATIO:

ECA: 120 cm/sec

RIGHT CAROTID ARTERY: Trace atherosclerotic change. Negative for
stenosis. Degree of narrowing less than 50% by ultrasound criteria.
No significant turbulent flow or spectral broadening.

RIGHT VERTEBRAL ARTERY:  Normal antegrade flow

LEFT CAROTID ARTERY: Similar scattered minor echogenic plaque
formation. No hemodynamically significant left ICA stenosis,
velocity elevation, or turbulent flow.

LEFT VERTEBRAL ARTERY:  Normal antegrade flow
IMPRESSION: Mild bilateral carotid atherosclerosis. Negative for stenosis.
Degree of narrowing less than 50% bilaterally by ultrasound
criteria.

Patent antegrade vertebral flow bilaterally

## 2023-09-15 ENCOUNTER — Encounter: Payer: Self-pay | Admitting: Gastroenterology

## 2023-10-03 ENCOUNTER — Ambulatory Visit: Admitting: Anesthesiology

## 2023-10-03 ENCOUNTER — Other Ambulatory Visit: Payer: Self-pay

## 2023-10-03 ENCOUNTER — Encounter
Admission: RE | Disposition: A | Discharge status: Home / Self Care | Payer: Self-pay | Source: Home / Self Care | Attending: Gastroenterology

## 2023-10-03 ENCOUNTER — Ambulatory Visit
Admission: RE | Admit: 2023-10-03 | Discharge: 2023-10-03 | Disposition: A | Discharge status: Home / Self Care | Attending: Gastroenterology | Admitting: Gastroenterology

## 2023-10-03 ENCOUNTER — Encounter: Payer: Self-pay | Admitting: Gastroenterology

## 2023-10-03 DIAGNOSIS — D123 Benign neoplasm of transverse colon: Secondary | ICD-10-CM | POA: Insufficient documentation

## 2023-10-03 DIAGNOSIS — D122 Benign neoplasm of ascending colon: Secondary | ICD-10-CM | POA: Insufficient documentation

## 2023-10-03 DIAGNOSIS — D125 Benign neoplasm of sigmoid colon: Secondary | ICD-10-CM | POA: Insufficient documentation

## 2023-10-03 DIAGNOSIS — Z90710 Acquired absence of both cervix and uterus: Secondary | ICD-10-CM | POA: Diagnosis not present

## 2023-10-03 DIAGNOSIS — Z7985 Long-term (current) use of injectable non-insulin antidiabetic drugs: Secondary | ICD-10-CM | POA: Insufficient documentation

## 2023-10-03 DIAGNOSIS — E119 Type 2 diabetes mellitus without complications: Secondary | ICD-10-CM | POA: Insufficient documentation

## 2023-10-03 DIAGNOSIS — I10 Essential (primary) hypertension: Secondary | ICD-10-CM | POA: Insufficient documentation

## 2023-10-03 DIAGNOSIS — Z9049 Acquired absence of other specified parts of digestive tract: Secondary | ICD-10-CM | POA: Diagnosis not present

## 2023-10-03 DIAGNOSIS — Z1211 Encounter for screening for malignant neoplasm of colon: Secondary | ICD-10-CM | POA: Diagnosis present

## 2023-10-03 DIAGNOSIS — Z7984 Long term (current) use of oral hypoglycemic drugs: Secondary | ICD-10-CM | POA: Insufficient documentation

## 2023-10-03 DIAGNOSIS — Z800 Family history of malignant neoplasm of digestive organs: Secondary | ICD-10-CM | POA: Insufficient documentation

## 2023-10-03 DIAGNOSIS — Q438 Other specified congenital malformations of intestine: Secondary | ICD-10-CM | POA: Diagnosis not present

## 2023-10-03 HISTORY — DX: Sleep apnea, unspecified: G47.30

## 2023-10-03 HISTORY — PX: COLONOSCOPY: SHX5424

## 2023-10-03 HISTORY — DX: Hyperlipidemia, unspecified: E78.5

## 2023-10-03 HISTORY — PX: POLYPECTOMY: SHX149

## 2023-10-03 HISTORY — DX: Other specified disorders of bone density and structure, unspecified site: M85.80

## 2023-10-03 LAB — GLUCOSE, CAPILLARY: Glucose-Capillary: 221 mg/dL — ABNORMAL HIGH (ref 70–99)

## 2023-10-03 SURGERY — COLONOSCOPY
Anesthesia: General

## 2023-10-03 MED ORDER — PROPOFOL 500 MG/50ML IV EMUL
INTRAVENOUS | Status: DC | PRN
  Administered 2023-10-03: 75 ug/kg/min via INTRAVENOUS

## 2023-10-03 MED ORDER — LIDOCAINE HCL (CARDIAC) PF 100 MG/5ML IV SOSY
PREFILLED_SYRINGE | INTRAVENOUS | Status: DC | PRN
  Administered 2023-10-03: 60 mg via INTRAVENOUS

## 2023-10-03 MED ORDER — SODIUM CHLORIDE 0.9 % IV SOLN: INTRAVENOUS | Status: DC

## 2023-10-03 MED ORDER — LIDOCAINE HCL (PF) 2 % IJ SOLN
INTRAMUSCULAR | Status: AC
  Filled 2023-10-03: qty 5

## 2023-10-03 MED ORDER — PROPOFOL 10 MG/ML IV BOLUS
INTRAVENOUS | Status: DC | PRN
  Administered 2023-10-03: 40 mg via INTRAVENOUS
  Administered 2023-10-03: 50 mg via INTRAVENOUS
  Administered 2023-10-03: 20 mg via INTRAVENOUS
  Administered 2023-10-03: 40 mg via INTRAVENOUS
  Administered 2023-10-03: 20 mg via INTRAVENOUS
  Administered 2023-10-03: 30 mg via INTRAVENOUS
  Administered 2023-10-03: 10 mg via INTRAVENOUS

## 2023-10-03 MED ORDER — DEXMEDETOMIDINE HCL IN NACL 80 MCG/20ML IV SOLN
INTRAVENOUS | Status: DC | PRN
Start: 2023-10-03 — End: 2023-10-03
  Administered 2023-10-03: 20 ug via INTRAVENOUS

## 2023-10-03 NOTE — Op Note (Signed)
 [facility] Patient Name: Kim Ashley Procedure Date: 10/03/2023 8:37 AM MRN: [id number] Account #: [id number] Date of Birth: [birth year] Admit Type: Outpatient Age: 63 Room: [facility] ENDO ROOM 2 Gender: Female Note Status: Finalized Instrument Name: Peds Colonoscope [id number] Procedure:             Colonoscopy Indications:           High risk colon cancer surveillance: Personal history                         of colonic polyps Providers:             Quintin Buckle DO, DO Medicines:             Monitored Anesthesia Care Complications:         No immediate complications. Estimated blood loss:                         Minimal. Procedure:             Pre-Anesthesia Assessment:                        - Prior to the procedure, a History and Physical was                         performed, and patient medications and allergies were                         reviewed. The patient is competent. The risks and                         benefits of the procedure and the sedation options and                         risks were discussed with the patient. All questions                         were answered and informed consent was obtained.                         Patient identification and proposed procedure were                         verified by the physician, the nurse, the anesthetist                         and the technician in the endoscopy suite. Mental                         Status Examination: alert and oriented. Airway                         Examination: normal oropharyngeal airway and neck                         mobility. Respiratory Examination: clear to                         auscultation. CV Examination: RRR, no murmurs, no S3  or S4. Prophylactic Antibiotics: The patient does not                         require prophylactic antibiotics. Prior                         Anticoagulants: The patient has taken no anticoagulant                          or antiplatelet agents. ASA Grade Assessment: II - A                         patient with mild systemic disease. After reviewing                         the risks and benefits, the patient was deemed in                         satisfactory condition to undergo the procedure. The                         anesthesia plan was to use monitored anesthesia care                         (MAC). Immediately prior to administration of                         medications, the patient was re-assessed for adequacy                         to receive sedatives. The heart rate, respiratory                         rate, oxygen saturations, blood pressure, adequacy of                         pulmonary ventilation, and response to care were                         monitored throughout the procedure. The physical                         status of the patient was re-assessed after the                         procedure.                        After obtaining informed consent, the colonoscope was                         passed under direct vision. Throughout the procedure,                         the patient's blood pressure, pulse, and oxygen                         saturations were monitored continuously. The  Colonoscope was introduced through the anus and                         advanced to the the cecum, identified by appendiceal                         orifice and ileocecal valve. The colonoscopy was                         unusually difficult due to a redundant colon,                         significant looping and the patient's body habitus.                         Successful completion of the procedure was aided by                         changing the patient to a prone position,                         straightening and shortening the scope to obtain bowel                         loop reduction, using scope torsion, applying                         abdominal  pressure, lavage and receiving assistance                         from additional staff. The patient tolerated the                         procedure well. The quality of the bowel preparation                         was evaluated using the BBPS [facility] Bowel Preparation                         Scale) with scores of: Right Colon = 3, Transverse                         Colon = 3 and Left Colon = 3 (entire mucosa seen well                         with no residual staining, small fragments of stool or                         opaque liquid). The total BBPS score equals 9. The                         ileocecal valve, appendiceal orifice, and rectum were                         photographed. Findings:      The perianal and digital rectal examinations were normal. Pertinent       negatives include normal sphincter tone.  Five sessile polyps were found in the sigmoid colon (2), transverse       colon (1) and cecum (2). The polyps were 1 to 2 mm in size. These polyps       were removed with a jumbo cold forceps. Resection and retrieval were       complete. Estimated blood loss was minimal.      A 6 to 7 mm polyp was found in the ascending colon. The polyp was       sessile. The polyp was removed with a cold snare. Resection and       retrieval were complete. Estimated blood loss was minimal.      The exam was otherwise without abnormality on direct and retroflexion       views. Impression:            - Five 1 to 2 mm polyps in the sigmoid colon, in the                         transverse colon and in the cecum, removed with a                         jumbo cold forceps. Resected and retrieved.                        - One 6 to 7 mm polyp in the ascending colon, removed                         with a cold snare. Resected and retrieved.                        - The examination was otherwise normal on direct and                         retroflexion views. Recommendation:        - Patient has a  contact number available for                         emergencies. The signs and symptoms of potential                         delayed complications were discussed with the patient.                         Return to normal activities tomorrow. Written                         discharge instructions were provided to the patient.                        - Discharge patient to home.                        - Resume previous diet.                        - Continue present medications.                        - No ibuprofen, naproxen, or other non-steroidal  anti-inflammatory drugs for 5 days after polyp removal.                        - OK to resume glp1 agonist                        - Await pathology results.                        - Repeat colonoscopy for surveillance based on                         pathology results.                        - Return to referring physician as previously                         scheduled.                        - Recommend that next colonoscopy the patient wear a                         COLOWRAP to assist with redundancy                        - The findings and recommendations were discussed with                         the patient. Procedure Code(s):     --- Professional ---                        [phone], Colonoscopy, flexible; with removal of                         tumor(s), polyp(s), or other lesion(s) by snare                         technique                        [id number], 59, Colonoscopy, flexible; with biopsy, single                         or multiple Diagnosis Code(s):     --- Professional ---                        Z86.010, Personal history of colonic polyps                        D12.5, Benign neoplasm of sigmoid colon                        D12.3, Benign neoplasm of transverse colon (hepatic                         flexure or splenic flexure)                        D12.0, Benign neoplasm of cecum  D12.2, Benign neoplasm of ascending colon CPT copyright 2022 American Medical Association. All rights reserved. The codes documented in this report are preliminary and upon coder review may  be revised to meet current compliance requirements. Attending Participation:      I personally performed the entire procedure. Polo Brisk, DO Quintin Buckle DO, DO 10/03/2023 9:54:14 AM This report has been signed electronically. Number of Addenda: 0 Note Initiated On: 10/03/2023 8:37 AM Scope Withdrawal Time: 0 hours 13 minutes 39 seconds  Total Procedure Duration: 0 hours 37 minutes 37 seconds  Estimated Blood Loss:  Estimated blood loss was minimal.      [facility]

## 2023-10-03 NOTE — Interval H&P Note (Signed)
 History and Physical Interval Note: Preprocedure H&P from 10/03/23  was reviewed and there was no interval change after seeing and examining the patient.  Written consent was obtained from the patient after discussion of risks, benefits, and alternatives. Patient has consented to proceed with Colonoscopy with possible intervention   10/03/2023 8:46 AM  Kim Ashley  has presented today for surgery, with the diagnosis of History of adenomatous polyp of colon (Z86.0101).  The various methods of treatment have been discussed with the patient and family. After consideration of risks, benefits and other options for treatment, the patient has consented to  Procedure(s): COLONOSCOPY (N/A) as a surgical intervention.  The patient's history has been reviewed, patient examined, no change in status, stable for surgery.  I have reviewed the patient's chart and labs.  Questions were answered to the patient's satisfaction.     Quintin Buckle

## 2023-10-03 NOTE — H&P (Signed)
 Pre-Procedure H&P   Patient ID: Kim Ashley is a 63 y.o. female.  Gastroenterology Provider: Quintin Buckle, DO  Referring Provider: Bruce Caper, PA PCP: Lenell Query, PA-C  Date: 10/03/2023  HPI [name] is a 63 y.o. female who presents today for Colonoscopy for personal history of colon polyps .  Last underwent colonoscopy in 2022- due to restricted mobility was only able to advance to the ascending colon per notation.  1 adenomatous polyp was removed during this exam.  She reports daily bowel movement.  Status post cholecystectomy appendectomy and hysterectomy  Adenomatous polyps in 2016 and 2008.  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   APPENDECTOMY     CHOLECYSTECTOMY     COLONOSCOPY     COLONOSCOPY WITH PROPOFOL  N/A 04/21/2015   Procedure: COLONOSCOPY WITH PROPOFOL ;  Surgeon: Marnee Sink, MD;  Location: [facility];  Service: Endoscopy;  Laterality: N/A;  Diabetic - oral meds   COLONOSCOPY WITH PROPOFOL  N/A 12/29/2020   Procedure: COLONOSCOPY WITH BIOPSY;  Surgeon: Marnee Sink, MD;  Location: [facility];  Service: Endoscopy;  Laterality: N/A;  Diabetic - oral meds; Unable to reach cecum   POLYPECTOMY  04/21/2015   Procedure: POLYPECTOMY INTESTINAL;  Surgeon: Marnee Sink, MD;  Location: [facility];  Service: Endoscopy;;   POLYPECTOMY N/A 12/29/2020   Procedure: POLYPECTOMY;  Surgeon: Marnee Sink, MD;  Location: [facility];  Service: Endoscopy;  Laterality: N/A;   TONSILLECTOMY     TUBAL  LIGATION      Family History Father- crc No other h/o GI disease or malignancy  Review of Systems  Constitutional:  Negative for activity change, appetite change, chills, diaphoresis, fatigue, fever and unexpected weight change.  HENT:  Negative for trouble swallowing and voice change.   Respiratory:  Negative for shortness of breath and wheezing.   Cardiovascular:  Negative for chest pain, palpitations and leg swelling.  Gastrointestinal:  Negative for abdominal distention, abdominal pain, anal bleeding, blood in stool, constipation, diarrhea, nausea, rectal pain and vomiting.  Musculoskeletal:  Negative for arthralgias and myalgias.  Skin:  Negative for color change and pallor.  Neurological:  Negative for dizziness, syncope and weakness.  Psychiatric/Behavioral:  Negative for confusion.   All other systems reviewed and are negative.    Medications No current facility-administered medications on file prior to encounter.   Current Outpatient Medications on File Prior to Encounter  Medication Sig Dispense Refill   aspirin EC 81 MG tablet Take 81 mg by mouth daily. Swallow whole.     glipiZIDE  (GLUCOTROL ) 10 MG tablet TAKE 1 TABLET(10 MG) BY MOUTH TWICE DAILY 180 tablet 3   glucose blood (BAYER CONTOUR NEXT TEST) test strip CONTOUR NEXT EZ. Dx E11.9 Test blood sugar fasting once daily 100 each 3   ipratropium (ATROVENT ) 0.03 % nasal spray USE 2 SPRAYS IN EACH NOSTRIL EVERY 12 HOURS 30 mL 0   Lancets MISC 1 each by Does not apply route daily. Dx E11.9 test  once daily 100 each 3   loratadine (CLARITIN) 10 MG tablet Take 10 mg by mouth daily. AM     losartan  (COZAAR ) 25 MG tablet TAKE 1 TABLET(25 MG) BY MOUTH DAILY 90 tablet 1   tirzepatide (MOUNJARO) 12.5 MG/0.5ML Pen Inject 12.5 mg into the skin once a week.     benzonatate  (TESSALON ) 100 MG capsule Take 1 capsule (100 mg total) by mouth 3 (three) times daily as needed. (Patient not taking: Reported on 10/03/2023) 30 capsule 0    levothyroxine  (SYNTHROID ) 25 MCG tablet TAKE 1 TABLET(25 MCG) BY MOUTH DAILY BEFORE AND BREAKFAST (Patient not taking: Reported on 10/03/2023) 90 tablet 0   metFORMIN  (GLUCOPHAGE ) 1000 MG tablet TAKE 1 TABLET(1000 MG) BY MOUTH TWICE DAILY WITH A MEAL (Patient not taking: Reported on 10/03/2023) 180 tablet 2   Multiple Vitamin (MULTIVITAMIN PO) Take by mouth. (Patient not taking: Reported on 10/03/2023)     pioglitazone  (ACTOS ) 30 MG tablet TAKE 1 TABLET(30 MG) BY MOUTH DAILY 90 tablet 3   rosuvastatin  (CRESTOR ) 10 MG tablet TAKE 1 TABLET(10 MG) BY MOUTH DAILY (Patient not taking: Reported on 10/03/2023) 90 tablet 3   TRULICITY  1.5 MG/0.5ML SOPN ADMINISTER 1.5 MG UNDER THE SKIN 1 TIME A WEEK (Patient not taking: Reported on 10/03/2023) 2 mL 2    Pertinent medications related to GI and procedure were reviewed by me with the patient prior to the procedure   Current Facility-Administered Medications:    0.9 %  sodium chloride  infusion, , Intravenous, Continuous, Quintin Buckle, DO, Last Rate: 20 mL/hr at 10/03/23 6578, Continued from Pre-op at 10/03/23 0839  sodium chloride  20 mL/hr at 10/03/23 4696       Allergies  Allergen Reactions   Byetta 10 Mcg Pen [Exenatide] Nausea And Vomiting   Lisinopril Cough   Niacin Other (See Comments)    Joint aches, flushing   Simvastatin Other (See Comments)    Joint aches, flushing   Allergies were reviewed by me prior to the procedure  Objective   Body mass index is 33.2 kg/m. Vitals:   10/03/23 0814  BP: 134/86  Pulse: 82  Resp: 13  Temp: 98.1 F (36.7 C)  TempSrc: Temporal  SpO2: 99%  Weight: 87.7 kg  Height: 5\' 4"  (1.626 m)     Physical Exam Vitals and nursing note reviewed.  Constitutional:      General: She is not in acute distress.    Appearance: Normal appearance. She is not ill-appearing, toxic-appearing or diaphoretic.  HENT:     Head: Normocephalic and atraumatic.     Nose: Nose normal.     Mouth/Throat:     Mouth: Mucous  membranes are moist.     Pharynx: Oropharynx is clear.  Eyes:     General: No scleral icterus.    Extraocular Movements: Extraocular movements intact.  Cardiovascular:     Rate and Rhythm: Normal rate and regular rhythm.     Heart sounds: Normal heart sounds. No murmur heard.    No friction rub. No gallop.  Pulmonary:     Effort: Pulmonary effort is normal. No respiratory distress.     Breath sounds: Normal breath sounds. No wheezing, rhonchi or rales.  Abdominal:     General: Bowel sounds are normal. There is no distension.     Palpations: Abdomen is soft.     Tenderness: There is no abdominal tenderness. There is no guarding or rebound.  Musculoskeletal:     Cervical back: Neck supple.  Right lower leg: No edema.     Left lower leg: No edema.  Skin:    General: Skin is warm and dry.     Coloration: Skin is not jaundiced or pale.  Neurological:     General: No focal deficit present.     Mental Status: She is alert and oriented to person, place, and time. Mental status is at baseline.  Psychiatric:        Mood and Affect: Mood normal.        Behavior: Behavior normal.        Thought Content: Thought content normal.        Judgment: Judgment normal.      Assessment:  Kim Ashley is a 63 y.o. female  who presents today for Colonoscopy for personal history of colon polyps .  Plan:  Colonoscopy with possible intervention today  Colonoscopy with possible biopsy, control of bleeding, polypectomy, and interventions as necessary has been discussed with the patient/patient representative. Informed consent was obtained from the patient/patient representative after explaining the indication, nature, and risks of the procedure including but not limited to death, bleeding, perforation, missed neoplasm/lesions, cardiorespiratory compromise, and reaction to medications. Opportunity for questions was given and appropriate answers were provided. Patient/patient representative has  verbalized understanding is amenable to undergoing the procedure.   Quintin Buckle, DO  [facility]  Portions of the record may have been created with voice recognition software. Occasional wrong-word or 'sound-a-like' substitutions may have occurred due to the inherent limitations of voice recognition software.  Read the chart carefully and recognize, using context, where substitutions may have occurred.

## 2023-10-03 NOTE — Transfer of Care (Signed)
 Immediate Anesthesia Transfer of Care Note  Patient: Kim Ashley  Procedure(s) Performed: COLONOSCOPY POLYPECTOMY, INTESTINE  Patient Location: PACU  Anesthesia Type:General  Level of Consciousness: sedated  Airway & Oxygen Therapy: Patient Spontanous Breathing  Post-op Assessment: Report given to RN and Post -op Vital signs reviewed and stable  Post vital signs: Reviewed and stable  Last Vitals:  Vitals Value Taken Time  BP    Temp    Pulse 78 10/03/23 0949  Resp 19 10/03/23 0949  SpO2 95 % 10/03/23 0949  Vitals shown include unfiled device data.  Last Pain:  Vitals:   10/03/23 0814  TempSrc: Temporal  PainSc: 0-No pain         Complications: No notable events documented.

## 2023-10-03 NOTE — Anesthesia Preprocedure Evaluation (Signed)
 Anesthesia Evaluation  Patient identified by MRN, date of birth, ID band Patient awake    Reviewed: Allergy & Precautions, H&P , NPO status , Patient's Chart, lab work & pertinent test results, reviewed documented beta blocker date and time   History of Anesthesia Complications Negative for: history of anesthetic complications  Airway Mallampati: I  TM Distance: >3 FB Neck ROM: full    Dental  (+) Dental Advidsory Given, Missing   Pulmonary neg shortness of breath, sleep apnea , neg COPD, neg recent URI   Pulmonary exam normal breath sounds clear to auscultation       Cardiovascular Exercise Tolerance: Good hypertension, (-) angina (-) Past MI and (-) Cardiac Stents Normal cardiovascular exam(-) dysrhythmias (-) Valvular Problems/Murmurs Rhythm:regular Rate:Normal     Neuro/Psych negative neurological ROS  negative psych ROS   GI/Hepatic negative GI ROS, Neg liver ROS,,,  Endo/Other  diabetes, Well Controlled, Oral Hypoglycemic Agents    Renal/GU negative Renal ROS  negative genitourinary   Musculoskeletal   Abdominal   Peds  Hematology negative hematology ROS (+)   Anesthesia Other Findings Past Medical History: No date: Diabetes mellitus without complication (HCC) No date: Environmental allergies No date: HLD (hyperlipidemia) No date: Hypertension No date: Osteopenia No date: Sleep apnea No date: Wears contact lenses   Reproductive/Obstetrics negative OB ROS                             Anesthesia Physical Anesthesia Plan  ASA: 2  Anesthesia Plan: General   Post-op Pain Management:    Induction: Intravenous  PONV Risk Score and Plan: 3 and Propofol  infusion, TIVA and Treatment may vary due to age or medical condition  Airway Management Planned: Natural Airway and Nasal Cannula  Additional Equipment:   Intra-op Plan:   Post-operative Plan:   Informed Consent: I have  reviewed the patients History and Physical, chart, labs and discussed the procedure including the risks, benefits and alternatives for the proposed anesthesia with the patient or authorized representative who has indicated his/her understanding and acceptance.     Dental Advisory Given  Plan Discussed with: Anesthesiologist, CRNA and Surgeon  Anesthesia Plan Comments:        Anesthesia Quick Evaluation

## 2023-10-04 LAB — SURGICAL PATHOLOGY

## 2023-10-07 NOTE — Anesthesia Postprocedure Evaluation (Signed)
 Anesthesia Post Note  Patient: Kim Ashley  Procedure(s) Performed: COLONOSCOPY POLYPECTOMY, INTESTINE  Patient location during evaluation: Endoscopy Anesthesia Type: General Level of consciousness: awake and alert Pain management: pain level controlled Vital Signs Assessment: post-procedure vital signs reviewed and stable Respiratory status: spontaneous breathing, nonlabored ventilation, respiratory function stable and patient connected to nasal cannula oxygen Cardiovascular status: blood pressure returned to baseline and stable Postop Assessment: no apparent nausea or vomiting Anesthetic complications: no   No notable events documented.   Last Vitals:  Vitals:   10/03/23 0959 10/03/23 1009  BP: 111/79 121/72  Pulse:    Resp:    Temp:    SpO2:      Last Pain:  Vitals:   10/04/23 0755  TempSrc:   PainSc: 0-No pain                 Vanice Genre

## 2023-11-18 ENCOUNTER — Encounter: Payer: Self-pay | Admitting: Advanced Practice Midwife
